# Patient Record
Sex: Female | Born: 1965 | Race: White | Hispanic: No | Marital: Single | Smoking: Never smoker
Health system: Southern US, Community
[De-identification: ages and names within clinical notes are randomized; demographics above are authoritative.]

## PROBLEM LIST (undated history)

## (undated) DIAGNOSIS — F419 Anxiety disorder, unspecified: Secondary | ICD-10-CM

## (undated) DIAGNOSIS — F329 Major depressive disorder, single episode, unspecified: Secondary | ICD-10-CM

## (undated) DIAGNOSIS — F909 Attention-deficit hyperactivity disorder, unspecified type: Secondary | ICD-10-CM

## (undated) DIAGNOSIS — F32A Depression, unspecified: Secondary | ICD-10-CM

## (undated) HISTORY — DX: Anxiety disorder, unspecified: F41.9

## (undated) HISTORY — DX: Attention-deficit hyperactivity disorder, unspecified type: F90.9

## (undated) HISTORY — DX: Major depressive disorder, single episode, unspecified: F32.9

## (undated) HISTORY — DX: Depression, unspecified: F32.A

---

## 1999-12-18 ENCOUNTER — Other Ambulatory Visit: Admission: RE | Admit: 1999-12-18 | Discharge: 1999-12-18 | Payer: Self-pay | Admitting: Obstetrics and Gynecology

## 2007-11-14 ENCOUNTER — Ambulatory Visit (HOSPITAL_COMMUNITY): Admission: RE | Admit: 2007-11-14 | Discharge: 2007-11-14 | Payer: Self-pay | Admitting: Obstetrics & Gynecology

## 2007-11-18 ENCOUNTER — Ambulatory Visit (HOSPITAL_COMMUNITY): Admission: RE | Admit: 2007-11-18 | Discharge: 2007-11-18 | Payer: Self-pay | Admitting: Family Medicine

## 2007-12-28 ENCOUNTER — Ambulatory Visit (HOSPITAL_COMMUNITY): Admission: RE | Admit: 2007-12-28 | Discharge: 2007-12-28 | Payer: Self-pay | Admitting: Obstetrics and Gynecology

## 2008-04-12 ENCOUNTER — Inpatient Hospital Stay (HOSPITAL_COMMUNITY): Admission: AD | Admit: 2008-04-12 | Discharge: 2008-04-12 | Payer: Self-pay | Admitting: Obstetrics and Gynecology

## 2008-04-12 ENCOUNTER — Emergency Department (HOSPITAL_COMMUNITY): Admission: EM | Admit: 2008-04-12 | Discharge: 2008-04-12 | Payer: Self-pay | Admitting: Emergency Medicine

## 2008-04-30 ENCOUNTER — Inpatient Hospital Stay (HOSPITAL_COMMUNITY): Admission: AD | Admit: 2008-04-30 | Discharge: 2008-05-01 | Payer: Self-pay | Admitting: Obstetrics and Gynecology
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
## 2011-02-14 ENCOUNTER — Emergency Department (HOSPITAL_COMMUNITY): Payer: BC Managed Care – PPO

## 2011-02-14 ENCOUNTER — Emergency Department (HOSPITAL_COMMUNITY)
Admission: EM | Admit: 2011-02-14 | Discharge: 2011-02-14 | Disposition: A | Discharge status: Home / Self Care | Payer: BC Managed Care – PPO | Attending: Emergency Medicine | Admitting: Emergency Medicine

## 2011-02-14 DIAGNOSIS — N949 Unspecified condition associated with female genital organs and menstrual cycle: Secondary | ICD-10-CM | POA: Insufficient documentation

## 2011-02-14 DIAGNOSIS — M545 Low back pain, unspecified: Secondary | ICD-10-CM | POA: Insufficient documentation

## 2011-02-14 DIAGNOSIS — X58XXXA Exposure to other specified factors, initial encounter: Secondary | ICD-10-CM | POA: Insufficient documentation

## 2011-02-14 DIAGNOSIS — F341 Dysthymic disorder: Secondary | ICD-10-CM | POA: Insufficient documentation

## 2011-02-14 DIAGNOSIS — IMO0002 Reserved for concepts with insufficient information to code with codable children: Secondary | ICD-10-CM | POA: Insufficient documentation

## 2011-02-14 LAB — TYPE AND SCREEN
ABO/RH(D): B POS
Antibody Screen: NEGATIVE

## 2011-02-14 LAB — DIFFERENTIAL
Basophils Absolute: 0.1 10*3/uL (ref 0.0–0.1)
Basophils Relative: 2 % — ABNORMAL HIGH (ref 0–1)
Neutro Abs: 3.4 10*3/uL (ref 1.7–7.7)
Neutrophils Relative %: 64 % (ref 43–77)

## 2011-02-14 LAB — URINALYSIS, ROUTINE W REFLEX MICROSCOPIC
Bilirubin Urine: NEGATIVE
Leukocytes, UA: NEGATIVE
Nitrite: NEGATIVE
Specific Gravity, Urine: 1.005 (ref 1.005–1.030)
pH: 7 (ref 5.0–8.0)

## 2011-02-14 LAB — PROTIME-INR
INR: 1.01 (ref 0.00–1.49)
Prothrombin Time: 13.5 seconds (ref 11.6–15.2)

## 2011-02-14 LAB — CBC
Hemoglobin: 12.7 g/dL (ref 12.0–15.0)
RBC: 4.67 MIL/uL (ref 3.87–5.11)

## 2011-02-14 LAB — PREGNANCY, URINE: Preg Test, Ur: POSITIVE

## 2011-04-20 LAB — CBC
HCT: 36.8
Hemoglobin: 11.7 — ABNORMAL LOW
MCHC: 33.9
MCV: 94.7
Platelets: 170
RDW: 13.1
RDW: 13.3
WBC: 7.2

## 2013-12-28 ENCOUNTER — Ambulatory Visit (INDEPENDENT_AMBULATORY_CARE_PROVIDER_SITE_OTHER): Payer: BC Managed Care – PPO | Admitting: Family Medicine

## 2013-12-28 ENCOUNTER — Telehealth: Payer: Self-pay

## 2013-12-28 VITALS — BP 104/66 | HR 81 | Temp 100.0°F | Resp 16 | Ht 65.5 in | Wt 116.4 lb

## 2013-12-28 DIAGNOSIS — H6690 Otitis media, unspecified, unspecified ear: Secondary | ICD-10-CM

## 2013-12-28 DIAGNOSIS — R509 Fever, unspecified: Secondary | ICD-10-CM

## 2013-12-28 DIAGNOSIS — H7290 Unspecified perforation of tympanic membrane, unspecified ear: Principal | ICD-10-CM

## 2013-12-28 DIAGNOSIS — H9209 Otalgia, unspecified ear: Secondary | ICD-10-CM

## 2013-12-28 LAB — POCT CBC
GRANULOCYTE PERCENT: 62.3 % (ref 37–80)
HCT, POC: 41.2 % (ref 37.7–47.9)
Hemoglobin: 12.8 g/dL (ref 12.2–16.2)
Lymph, poc: 1.8 (ref 0.6–3.4)
MCH: 27.5 pg (ref 27–31.2)
MCHC: 31.1 g/dL — AB (ref 31.8–35.4)
MCV: 88.5 fL (ref 80–97)
MID (CBC): 0.5 (ref 0–0.9)
MPV: 8.9 fL (ref 0–99.8)
PLATELET COUNT, POC: 303 10*3/uL (ref 142–424)
POC Granulocyte: 3.9 (ref 2–6.9)
POC LYMPH %: 29.8 % (ref 10–50)
POC MID %: 7.9 % (ref 0–12)
RBC: 4.65 M/uL (ref 4.04–5.48)
RDW, POC: 13.4 %
WBC: 6.2 10*3/uL (ref 4.6–10.2)

## 2013-12-28 MED ORDER — OFLOXACIN 0.3 % OT SOLN: 10.0000 [drp] | Freq: Every day | OTIC | Status: DC

## 2013-12-28 MED ORDER — AMOXICILLIN-POT CLAVULANATE 875-125 MG PO TABS: 1.0000 | ORAL_TABLET | Freq: Two times a day (BID) | ORAL | Status: DC

## 2013-12-28 MED ORDER — HYDROCODONE-ACETAMINOPHEN 5-325 MG PO TABS: 1.0000 | ORAL_TABLET | Freq: Four times a day (QID) | ORAL | Status: DC | PRN

## 2013-12-28 NOTE — Telephone Encounter (Signed)
Left message on machine to call back  

## 2013-12-28 NOTE — Telephone Encounter (Signed)
Patient has rupture of the ear drum. It is the only type of medication that is safe. Sorry.

## 2013-12-28 NOTE — Telephone Encounter (Signed)
[facility] at [facility])   Patient states ofloxacin (FLOXIN) 0.3 % otic solution Is to expensive and would like something else.  She has not met her deductible for the year.   [phone] (H)

## 2013-12-28 NOTE — Progress Notes (Signed)
Subjective:    Patient ID: Stephanie Fisher, female    DOB: [birth year], 48 y.o.   MRN: [id number]  HPI Stephanie Fisher is a 48 y.o. female  R ear pain past 2-3 days.   initially nasal congestion few days ago - proceeded to congestion/pain in R ear 2 days ago.  Fever - 99 at home. Difficulty hearing out of R ear, feels blocked.  Discharge noted yesterday - clear-yellow.  Did put Q tip in ear this morning for first time.   Tx: otc herbal ear drops.    There are no active problems to display for this patient.  Past Medical History  Diagnosis Date  . Anxiety   . Depression   . ADHD (attention deficit hyperactivity disorder)    History reviewed. No pertinent past surgical history. No Known Allergies Prior to Admission medications   Medication Sig Start Date End Date Taking? Authorizing Provider  atomoxetine (STRATTERA) 40 MG capsule Take 40 mg by mouth daily.   Yes Historical Provider, MD  FLUoxetine (PROZAC) 10 MG capsule Take 10 mg by mouth daily.   Yes Historical Provider, MD   History   Social History  . Marital Status: Single    Spouse Name: N/A    Number of Children: N/A  . Years of Education: N/A   Occupational History  . Not on file.   Social History Main Topics  . Smoking status: Never Smoker   . Smokeless tobacco: Not on file  . Alcohol Use: No  . Drug Use: No  . Sexual Activity: Yes   Other Topics Concern  . Not on file   Social History Narrative  . No narrative on file       Review of Systems  Constitutional: Positive for fever and chills.  HENT: Positive for ear discharge, ear pain and hearing loss. Negative for facial swelling.   Neurological: Positive for headaches.       Objective:   Physical Exam  Vitals reviewed. Constitutional: She is oriented to person, place, and time. She appears well-developed and well-nourished. No distress.  HENT:  Head: Normocephalic and atraumatic.  Right Ear: There is drainage (purulent d/c in canal, no apparent  canal edema/erythema. ). No mastoid tenderness (not mastoid ttp, but tender post auricular lymph node. ). Tympanic membrane is injected, perforated (suspected anterior inferior rupture, with purulent discharge in canal, erythematous tm.  ) and erythematous. A middle ear effusion is present. No hemotympanum. Decreased hearing is noted.  Left Ear: Hearing, tympanic membrane, external ear and ear canal normal.  Nose: Nose normal.  Mouth/Throat: Oropharynx is clear and moist. No oropharyngeal exudate.  Eyes: Conjunctivae and EOM are normal. Pupils are equal, round, and reactive to light.  Neck: Neck supple.  Cardiovascular: Normal rate, regular rhythm, normal heart sounds and intact distal pulses.   No murmur heard. Pulmonary/Chest: Effort normal and breath sounds normal. No respiratory distress. She has no wheezes. She has no rhonchi.  Lymphadenopathy:    She has no cervical adenopathy.  Neurological: She is alert and oriented to person, place, and time.  Skin: Skin is warm and dry. No rash noted.  Psychiatric: She has a normal mood and affect. Her behavior is normal.   Filed Vitals:   12/28/13 1043  BP: 104/66  Pulse: 81  Temp: 100 F (37.8 C)  TempSrc: Oral  Resp: 16  Height: 5' 5.5" (1.664 m)  Weight: 116 lb 6.4 oz (52.799 kg)  SpO2: 100%   Results for orders  placed in visit on 12/28/13  POCT CBC      Result Value Ref Range   WBC 6.2  4.6 - 10.2 K/uL   Lymph, poc 1.8  0.6 - 3.4   POC LYMPH PERCENT 29.8  10 - 50 %L   MID (cbc) 0.5  0 - 0.9   POC MID % 7.9  0 - 12 %M   POC Granulocyte 3.9  2 - 6.9   Granulocyte percent 62.3  37 - 80 %G   RBC 4.65  4.04 - 5.48 M/uL   Hemoglobin 12.8  12.2 - 16.2 g/dL   HCT, POC 16.141.2  09.637.7 - 47.9 %   MCV 88.5  80 - 97 fL   MCH, POC 27.5  27 - 31.2 pg   MCHC 31.1 (*) 31.8 - 35.4 g/dL   RDW, POC 04.513.4     Platelet Count, POC 303  142 - 424 K/uL   MPV 8.9  0 - 99.8 fL       Assessment & Plan:   Stephanie JarvisLee A Plain is a 48 y.o. female Otitis media  with spontaneous rupture of eardrum - Plan: ofloxacin (FLOXIN) 0.3 % otic solution, amoxicillin-clavulanate (AUGMENTIN) 875-125 MG per tablet  Pain, ear  Fever - Plan: POCT CBC  Start Augmentin, floxin otic for R AOM with rupture. advil if needed, and lortab for increased pain.   Mastoid nontender today. If any worsening of sx's into tomorrow - recheck with Eula Listenyan Dunn. PA-C.  rtc precautions.   Meds ordered this encounter  Medications  . atomoxetine (STRATTERA) 40 MG capsule    Sig: Take 40 mg by mouth daily.  Marland Kitchen. FLUoxetine (PROZAC) 10 MG capsule    Sig: Take 10 mg by mouth daily.  Marland Kitchen. ofloxacin (FLOXIN) 0.3 % otic solution    Sig: Place 10 drops into the right ear daily.    Dispense:  10 mL    Refill:  0  . amoxicillin-clavulanate (AUGMENTIN) 875-125 MG per tablet    Sig: Take 1 tablet by mouth 2 (two) times daily.    Dispense:  20 tablet    Refill:  0   Patient Instructions  Start both antibiotic drops to ear and oral antibiotic. Advil ok for pain or fever. If needed - can take other prescription pain medicine.

## 2013-12-28 NOTE — Telephone Encounter (Signed)
Please advise on more cost effective alternative to the ofloxacin drops

## 2013-12-28 NOTE — Patient Instructions (Addendum)
Start antibiotic drops to ear and oral antibiotic. Advil ok for pain or fever. If needed - can take other prescription pain medicine.  If any worsening into tomorrow- return for evaluation with Eula Listen. PA-C.  Return to the clinic or go to the nearest emergency room if any of your symptoms worsen or new symptoms occur. Otitis Media, Adult Otitis media is redness, soreness, and swelling (inflammation) of the middle ear. Otitis media may be caused by allergies or, most commonly, by infection. Often it occurs as a complication of the common cold. SIGNS AND SYMPTOMS Symptoms of otitis media may include:  Earache.  Fever.  Ringing in your ear.  Headache.  Leakage of fluid from the ear. DIAGNOSIS To diagnose otitis media, your health care provider will examine your ear with an otoscope. This is an instrument that allows your health care provider to see into your ear in order to examine your eardrum. Your health care provider also will ask you questions about your symptoms. TREATMENT  Typically, otitis media resolves on its own within 3 5 days. Your health care provider may prescribe medicine to ease your symptoms of pain. If otitis media does not resolve within 5 days or is recurrent, your health care provider may prescribe antibiotic medicines if he or she suspects that a bacterial infection is the cause. HOME CARE INSTRUCTIONS   Take your medicine as directed until it is gone, even if you feel better after the first few days.  Only take over-the-counter or prescription medicines for pain, discomfort, or fever as directed by your health care provider.  Follow up with your health care provider as directed. SEEK MEDICAL CARE IF:  You have otitis media only in one ear or bleeding from your nose or both.  You notice a lump on your neck.  You are not getting better in 3 5 days.  You feel worse instead of better. SEEK IMMEDIATE MEDICAL CARE IF:   You have pain that is not controlled with  medicine.  You have swelling, redness, or pain around your ear or stiffness in your neck.  You notice that part of your face is paralyzed.  You notice that the bone behind your ear (mastoid) is tender when you touch it. MAKE SURE YOU:   Understand these instructions.  Will watch your condition.  Will get help right away if you are not doing well or get worse. Document Released: 04/10/2004 Document Revised: 04/26/2013 Document Reviewed: 01/31/2013 [facility] Patient Information 2014 [place], [place].

## 2013-12-29 NOTE — Telephone Encounter (Signed)
Pt.notified

## 2014-01-03 ENCOUNTER — Ambulatory Visit (INDEPENDENT_AMBULATORY_CARE_PROVIDER_SITE_OTHER): Payer: BC Managed Care – PPO | Admitting: Family Medicine

## 2014-01-03 VITALS — BP 102/66 | HR 76 | Temp 98.0°F | Resp 16 | Ht 65.5 in | Wt 118.0 lb

## 2014-01-03 DIAGNOSIS — H7290 Unspecified perforation of tympanic membrane, unspecified ear: Principal | ICD-10-CM

## 2014-01-03 DIAGNOSIS — H6690 Otitis media, unspecified, unspecified ear: Secondary | ICD-10-CM

## 2014-01-03 MED ORDER — AMOXICILLIN-POT CLAVULANATE 875-125 MG PO TABS: 1.0000 | ORAL_TABLET | Freq: Two times a day (BID) | ORAL | Status: DC

## 2014-01-03 NOTE — Progress Notes (Signed)
Subjective:    Patient ID: Stephanie JarvisLee A Ton, female    DOB: 01/25/1966, 48 y.o.   MRN: [id number]  HPI Stephanie Fisher is a 48 y.o. female  Seen 6 days ago with 2-3 day hx R ear pain.  Diagnosed with R AOM with rupture. Started on Augmentin and floxin otic. Had low grade fever, CBC ok in office. D/t rupture - floxin otic rx - noted telephone messages re: cost.   Here for follow up.   Was not able to afford drops, so did not start the floxin otic drops.  Has been taking the other antibiotic (augmentin - taking twice per day, no missed doses).  No fever. Less sore - pain has almost completely subsided - only occasional ache/twinge.  Still unable to hear much out of the R ear.  No recent discharge.  Last noted d/c few days ago.  Did use some of mother's antibiotic drops - for ear infection, but unknown name.  Used those drops twice only. Not using those now. Took ibuprofen, did not need to fill hydrocodone.   Min runny nose. Feeling well otherwise.   There are no active problems to display for this patient.  Past Medical History  Diagnosis Date  . Anxiety   . Depression   . ADHD (attention deficit hyperactivity disorder)    No past surgical history on file. No Known Allergies Prior to Admission medications   Medication Sig Start Date End Date Taking? Authorizing Aul Mangieri  amoxicillin-clavulanate (AUGMENTIN) 875-125 MG per tablet Take 1 tablet by mouth 2 (two) times daily. 12/28/13  Yes Shade FloodJeffrey R Greene, MD  atomoxetine (STRATTERA) 40 MG capsule Take 40 mg by mouth daily.   Yes Historical Giannina Bartolome, MD  FLUoxetine (PROZAC) 10 MG capsule Take 10 mg by mouth daily.   Yes Historical Kendria Halberg, MD  HYDROcodone-acetaminophen (NORCO/VICODIN) 5-325 MG per tablet Take 1 tablet by mouth every 6 (six) hours as needed for moderate pain. 12/28/13  Yes Shade FloodJeffrey R Greene, MD   History   Social History  . Marital Status: Single    Spouse Name: N/A    Number of Children: N/A  . Years of Education: N/A    Occupational History  . Not on file.   Social History Main Topics  . Smoking status: Never Smoker   . Smokeless tobacco: Not on file  . Alcohol Use: No  . Drug Use: No  . Sexual Activity: Yes   Other Topics Concern  . Not on file   Social History Narrative  . No narrative on file       Review of Systems  Constitutional: Negative for fever and chills.  HENT: Positive for ear pain and hearing loss. Negative for ear discharge and facial swelling.   Skin: Positive for rash.       Objective:   Physical Exam  Vitals reviewed. Constitutional: She is oriented to person, place, and time. She appears well-developed and well-nourished. No distress.  HENT:  Head: Normocephalic and atraumatic.  Right Ear: External ear and ear canal normal. No drainage, swelling or tenderness (mastoid nt. ). Tympanic membrane is injected, erythematous and bulging. Tympanic membrane is not perforated. A middle ear effusion is present. Decreased hearing is noted.  Left Ear: Hearing, tympanic membrane, external ear and ear canal normal.  Nose: Nose normal.  Mouth/Throat: Oropharynx is clear and moist. No oropharyngeal exudate.  Eyes: Conjunctivae and EOM are normal. Pupils are equal, round, and reactive to light.  Cardiovascular: Normal rate, regular rhythm, normal  heart sounds and intact distal pulses.   No murmur heard. Pulmonary/Chest: Effort normal and breath sounds normal. No respiratory distress. She has no wheezes. She has no rhonchi.  Neurological: She is alert and oriented to person, place, and time.  Skin: Skin is warm and dry. No rash noted.  Psychiatric: She has a normal mood and affect. Her behavior is normal.  mastoid nontender.    Filed Vitals:   01/03/14 1129  BP: 102/66  Pulse: 76  Temp: 98 F (36.7 C)  TempSrc: Oral  Resp: 16  Height: 5' 5.5" (1.664 m)  Weight: 118 lb (53.524 kg)  SpO2: 100%       Assessment & Plan:   Stephanie Fisher is a 48 y.o. female Otitis media  with spontaneous rupture of eardrum - Plan: amoxicillin-clavulanate (AUGMENTIN) 875-125 MG per tablet Now without visible perforation, but persistent effusion, injection of TM and bulging TM.  Will continue augmentin for full 2 week course, then recheck. Advised against water in ear, and discussed decreased hearing may persist until fluid resolves, and possibility of not regaining complete hearing after this infection. rtc precautions.  recheck in 8 days.   Meds ordered this encounter  Medications  . amoxicillin-clavulanate (AUGMENTIN) 875-125 MG per tablet    Sig: Take 1 tablet by mouth 2 (two) times daily.    Dispense:  8 tablet    Refill:  0   Patient Instructions  Continue antibiotic for 2 full weeks.  Recheck at end of antibiotic (June 24th after 4pm, or June 25th 8-2). Try to keep water out of affected ear - especially if starts draining again.  Return to the clinic or go to the nearest emergency room if any of your symptoms worsen or new symptoms occur.  Otitis Media Otitis media is redness, soreness, and puffiness (swelling) in the space just behind your eardrum (middle ear). It may be caused by allergies or infection. It often happens along with a cold. HOME CARE  Take your medicine as told. Finish it even if you start to feel better.  Only take over-the-counter or prescription medicines for pain, discomfort, or fever as told by your doctor.  Follow up with your doctor as told. GET HELP IF:  You have otitis media only in one ear, or bleeding from your nose, or both.  You notice a lump on your neck.  You are not getting better in 3-5 days.  You feel worse instead of better. GET HELP RIGHT AWAY IF:   You have pain that is not helped with medicine.  You have puffiness, redness, or pain around your ear.  You get a stiff neck.  You cannot move part of your face (paralysis).  You notice that the bone behind your ear hurts when you touch it. MAKE SURE YOU:   Understand  these instructions.  Will watch your condition.  Will get help right away if you are not doing well or get worse. Document Released: 12/23/2007 Document Revised: 07/11/2013 Document Reviewed: 01/31/2013 [facility] Patient Information 2015 [place], [place]. This information is not intended to replace advice given to you by your health care Sianni Cloninger. Make sure you discuss any questions you have with your health care Izyk Marty.

## 2014-01-03 NOTE — Patient Instructions (Signed)
Continue antibiotic for 2 full weeks.  Recheck at end of antibiotic (June 24th after 4pm, or June 25th 8-2). Try to keep water out of affected ear - especially if starts draining again.  Return to the clinic or go to the nearest emergency room if any of your symptoms worsen or new symptoms occur.  Otitis Media Otitis media is redness, soreness, and puffiness (swelling) in the space just behind your eardrum (middle ear). It may be caused by allergies or infection. It often happens along with a cold. HOME CARE  Take your medicine as told. Finish it even if you start to feel better.  Only take over-the-counter or prescription medicines for pain, discomfort, or fever as told by your doctor.  Follow up with your doctor as told. GET HELP IF:  You have otitis media only in one ear, or bleeding from your nose, or both.  You notice a lump on your neck.  You are not getting better in 3-5 days.  You feel worse instead of better. GET HELP RIGHT AWAY IF:   You have pain that is not helped with medicine.  You have puffiness, redness, or pain around your ear.  You get a stiff neck.  You cannot move part of your face (paralysis).  You notice that the bone behind your ear hurts when you touch it. MAKE SURE YOU:   Understand these instructions.  Will watch your condition.  Will get help right away if you are not doing well or get worse. Document Released: 12/23/2007 Document Revised: 07/11/2013 Document Reviewed: 01/31/2013 [facility] Patient Information 2015 [place], [place]. This information is not intended to replace advice given to you by your health care provider. Make sure you discuss any questions you have with your health care provider.

## 2016-08-22 ENCOUNTER — Ambulatory Visit (INDEPENDENT_AMBULATORY_CARE_PROVIDER_SITE_OTHER): Payer: BLUE CROSS/BLUE SHIELD | Admitting: Urgent Care

## 2016-08-22 VITALS — BP 98/58 | HR 71 | Temp 98.8°F | Resp 16 | Ht 65.5 in | Wt 124.2 lb

## 2016-08-22 DIAGNOSIS — R109 Unspecified abdominal pain: Secondary | ICD-10-CM | POA: Diagnosis not present

## 2016-08-22 DIAGNOSIS — K5900 Constipation, unspecified: Secondary | ICD-10-CM

## 2016-08-22 DIAGNOSIS — R195 Other fecal abnormalities: Secondary | ICD-10-CM | POA: Diagnosis not present

## 2016-08-22 DIAGNOSIS — K319 Disease of stomach and duodenum, unspecified: Secondary | ICD-10-CM | POA: Diagnosis not present

## 2016-08-22 DIAGNOSIS — R1013 Epigastric pain: Secondary | ICD-10-CM

## 2016-08-22 DIAGNOSIS — K30 Functional dyspepsia: Secondary | ICD-10-CM

## 2016-08-22 DIAGNOSIS — R2232 Localized swelling, mass and lump, left upper limb: Secondary | ICD-10-CM | POA: Diagnosis not present

## 2016-08-22 NOTE — Progress Notes (Signed)
    MRN: [id number] DOB: 12/20/1965  Subjective:   Markus JarvisLee A Wojdyla is a 51 y.o. female presenting for chief complaint of Mass (Under Left Arm Pit)  Mass - Reports a 3 month history of mass over her left arm pit. Patient wants to make sure she is okay. Admits that she had a screening mammogram ~6 months ago and was commpletly normal. Denies fever, redness, pain, worsening swelling, discharge, bleeding, nipple inversion, nipple discharge, skin changes of the breast.  GI - Reports longstanding history of intermittent constipation, loose stools, belly cramping and upset stomach. Patient has worked with a GI doctor before but did not finish work up. She plans on setting up another appointment for follow up. Patient is adopted so she does not know her family history. Denies ROS as above, bloody stools, vomiting, weight loss. Has a history of an eating disorder.  Nedra HaiLee currently has no medications in their medication list. Also has No Known Allergies.  Nedra HaiLee  has a past medical history of ADHD (attention deficit hyperactivity disorder); Anxiety; and Depression. Also  has no past surgical history on file.  Objective:   Vitals: BP (!) 98/58   Pulse 71   Temp 98.8 F (37.1 C) (Oral)   Resp 16   Ht 5' 5.5" (1.664 m)   Wt 124 lb 4 oz (56.4 kg)   LMP 08/01/2016 (Approximate)   SpO2 98%   BMI 20.36 kg/m   Physical Exam  Constitutional: She is oriented to person, place, and time. She appears well-developed and well-nourished.  HENT:  Mouth/Throat: Oropharynx is clear and moist.  Eyes: No scleral icterus.  Neck: Normal range of motion. Neck supple.  Cardiovascular: Normal rate, regular rhythm and intact distal pulses.  Exam reveals no gallop and no friction rub.   No murmur heard. Pulmonary/Chest: No respiratory distress. She has no wheezes. She has no rales.  Abdominal: Soft. Bowel sounds are normal. She exhibits no distension and no mass. There is tenderness (mild, lower abdomen). There is no  guarding.  Lymphadenopathy:    She has no cervical adenopathy.  Neurological: She is alert and oriented to person, place, and time.  Skin: Skin is warm and dry. Rash (patches of macular rash over axilla bilaterally) noted.      Assessment and Plan :   1. Mass of left axilla - I suspect a lipoma. Discussed differential with patient. She will think about monitoring versus removal. Recheck in 4 weeks or sooner if worsening.  2. Constipation, unspecified constipation type 3. Loose stools 4. Belly pain 5. Chronic upset stomach - Patient will try to go to GI doctor again. She will let me know if she would like to pursue blood work with me.   Wallis BambergMario Mercedes Fort, PA-C [facility] [phone] 08/22/2016  2:15 PM

## 2016-08-22 NOTE — Patient Instructions (Addendum)
Lipoma Introduction A lipoma is a noncancerous (benign) tumor that is made up of fat cells. This is a very common type of soft-tissue growth. Lipomas are usually found under the skin (subcutaneous). They may occur in any tissue of the body that contains fat. Common areas for lipomas to appear include the back, shoulders, buttocks, and thighs. Lipomas grow slowly, and they are usually painless. Most lipomas do not cause problems and do not require treatment. What are the causes? The cause of this condition is not known. What increases the risk? This condition is more likely to develop in:  People who are 53-57 years old.  People who have a family history of lipomas. What are the signs or symptoms? A lipoma usually appears as a small, round bump under the skin. It may feel soft or rubbery, but the firmness can vary. Most lipomas are not painful. However, a lipoma may become painful if it is located in an area where it pushes on nerves. How is this diagnosed? A lipoma can usually be diagnosed with a physical exam. You may also have tests to confirm the diagnosis and to rule out other conditions. Tests may include:  Imaging tests, such as a CT scan or MRI.  Removal of a tissue sample to be looked at under a microscope (biopsy). How is this treated? Treatment is not needed for small lipomas that are not causing problems. If a lipoma continues to get bigger or it causes problems, removal is often the best option. Lipomas can also be removed to improve appearance. Removal of a lipoma is usually done with a surgery in which the fatty cells and the surrounding capsule are removed. Most often, a medicine that numbs the area (local anesthetic) is used for this procedure. Follow these instructions at home:  Keep all follow-up visits as directed by your health care provider. This is important. Contact a health care provider if:  Your lipoma becomes larger or hard.  Your lipoma becomes painful, red, or  increasingly swollen. These could be signs of infection or a more serious condition. This information is not intended to replace advice given to you by your health care provider. Make sure you discuss any questions you have with your health care provider. Document Released: 06/26/2002 Document Revised: 12/12/2015 Document Reviewed: 07/02/2014  2017 Elsevier    Lipoma Removal Lipoma removal is a surgical procedure to remove a noncancerous (benign) tumor that is made up of fat cells (lipoma). Most lipomas are small and painless and do not require treatment. They can form in many areas of the body but are most common under the skin of the back, shoulders, arms, and thighs. You may need lipoma removal if you have a lipoma that is large, growing, or causing discomfort. Lipoma removal may also be done for cosmetic reasons. Tell a health care provider about:  Any allergies you have.  All medicines you are taking, including vitamins, herbs, eye drops, creams, and over-the-counter medicines.  Any problems you or family members have had with anesthetic medicines.  Any blood disorders you have.  Any surgeries you have had.  Any medical conditions you have.  Whether you are pregnant or may be pregnant. What are the risks? Generally, this is a safe procedure. However, problems may occur, including:  Infection.  Bleeding.  Allergic reactions to medicines.  Damage to nerves or blood vessels near the lipoma.  Scarring. What happens before the procedure? Staying hydrated  Follow instructions from your health care provider about hydration,  which may include:  Up to 2 hours before the procedure - you may continue to drink clear liquids, such as water, clear fruit juice, black coffee, and plain tea. Eating and drinking restrictions  Follow instructions from your health care provider about eating and drinking, which may include:  8 hours before the procedure - stop eating heavy meals or  foods such as meat, fried foods, or fatty foods.  6 hours before the procedure - stop eating light meals or foods, such as toast or cereal.  6 hours before the procedure - stop drinking milk or drinks that contain milk.  2 hours before the procedure - stop drinking clear liquids. Medicines  Ask your health care provider about:  Changing or stopping your regular medicines. This is especially important if you are taking diabetes medicines or blood thinners.  Taking medicines such as aspirin and ibuprofen. These medicines can thin your blood. Do not take these medicines before your procedure if your health care provider instructs you not to.  You may be given antibiotic medicine to help prevent infection. General instructions  Ask your health care provider how your surgical site will be marked or identified.  You will have a physical exam. Your health care provider will check the size of the lipoma and whether it can be moved easily.  You may have imaging tests, such as:  X-rays.  CT scan.  MRI.  Plan to have someone take you home from the hospital or clinic. What happens during the procedure?  To reduce your risk of infection:  Your health care team will wash or sanitize their hands.  Your skin will be washed with soap.  You will be given one or more of the following:  A medicine to help you relax (sedative).  A medicine to numb the area (local anesthetic).  A medicine to make you fall asleep (general anesthetic).  A medicine that is injected into an area of your body to numb everything below the injection site (regional anesthetic).  An incision will be made over the lipoma or very near the lipoma. The incision may be made in a natural skin line or crease.  Tissues, nerves, and blood vessels near the lipoma will be moved out of the way.  The lipoma and the capsule that surrounds it will be separated from the surrounding tissues.  The lipoma will be  removed.  The incision may be closed with stitches (sutures).  A bandage (dressing) will be placed over the incision. What happens after the procedure?  Do not drive for 24 hours if you received a sedative.  Your blood pressure, heart rate, breathing rate, and blood oxygen level will be monitored until the medicines you were given have worn off. This information is not intended to replace advice given to you by your health care provider. Make sure you discuss any questions you have with your health care provider. Document Released: 09/19/2015 Document Revised: 12/12/2015 Document Reviewed: 09/19/2015 Elsevier Interactive Patient Education  2017 [employer]     IF you received an x-ray today, you will receive an invoice from [facility]. Please contact [facility] at [phone] with questions or concerns regarding your invoice.   IF you received labwork today, you will receive an invoice from [facility]. Please contact [facility] at [phone] with questions or concerns regarding your invoice.   Our billing staff will not be able to assist you with questions regarding bills from these companies.  You will be contacted with the lab  results as soon as they are available. The fastest way to get your results is to activate your My Chart account. Instructions are located on the last page of this paperwork. If you have not heard from [place] regarding the results in 2 weeks, please contact this office.

## 2019-12-08 ENCOUNTER — Other Ambulatory Visit: Payer: Self-pay | Admitting: Family Medicine

## 2019-12-08 DIAGNOSIS — Z8659 Personal history of other mental and behavioral disorders: Secondary | ICD-10-CM

## 2019-12-12 ENCOUNTER — Encounter: Payer: Self-pay | Admitting: Neurology

## 2020-01-01 ENCOUNTER — Ambulatory Visit
Admission: RE | Admit: 2020-01-01 | Discharge: 2020-01-01 | Disposition: A | Discharge status: Home / Self Care | Payer: BC Managed Care – PPO | Source: Ambulatory Visit | Attending: Family Medicine | Admitting: Family Medicine

## 2020-01-01 DIAGNOSIS — Z8659 Personal history of other mental and behavioral disorders: Secondary | ICD-10-CM

## 2020-01-08 ENCOUNTER — Other Ambulatory Visit: Payer: Self-pay

## 2020-01-08 ENCOUNTER — Encounter: Payer: Self-pay | Admitting: Counselor

## 2020-01-08 ENCOUNTER — Ambulatory Visit (INDEPENDENT_AMBULATORY_CARE_PROVIDER_SITE_OTHER): Payer: BC Managed Care – PPO | Admitting: Counselor

## 2020-01-08 DIAGNOSIS — F4321 Adjustment disorder with depressed mood: Secondary | ICD-10-CM

## 2020-01-08 DIAGNOSIS — F4323 Adjustment disorder with mixed anxiety and depressed mood: Secondary | ICD-10-CM

## 2020-01-08 DIAGNOSIS — Z634 Disappearance and death of family member: Secondary | ICD-10-CM

## 2020-01-08 DIAGNOSIS — F909 Attention-deficit hyperactivity disorder, unspecified type: Secondary | ICD-10-CM

## 2020-01-08 NOTE — Progress Notes (Signed)
[facility]  Patient Name: Stephanie Fisher MRN: [id number] Date of Birth: 14-Jul-1966 Age: 54 y.o. Education: 14 years  Referral Circumstances and Background Information  Stephanie Fisher is a 54 y.o., right-hand dominant, single with a history of psychiatric difficulties (referral mentions diagnoses of GAD, panic disorder, unspecified mood disorder), polysubstance dependence (in remission), and concerns about cognitive functions. She was referred by Dr. Darron Doom with [facility] for neurocognitive evaluation.    On interview, the patient stated that had an episode of depersonalization/dissociation after waking up last summer. Shortly after that, her son passed. She feels like since that time, she has been experiencing significant difficulties with organization, planning, remembering things, and feeling scattered. She admits she has always had some degree of difficulty with the same and has questioned if she may not have ADHD. After the episode, she felt like she was waking up every morning feeling "terrified," and generally was under a lot of stress. 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 She felt like she wasn't performing well and had a panic attack as a result. 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 She has been not back to work since. She thinks her issues are marginally better since then, she is no longer "falling apart" when trying to cook a meal, but she still is having a hard time. She specifically endorsed  difficulties with planning, sequencing behaviors, organizing things, with memory, distraction, and in general feels scattered.  With respect to affective functioning the patient feels like she is under a great deal of stress trying to manage her life, her childrens' lives (she has two children at home), and she is also taking care of her mother.She feels anxious, and it sounds like she feels somewhat overwhelmed and has for a long time. She acknowledged feeling sad and became tearful when discussing it. Nevertheless, she reported that she is able to sleep adequately, usually 7-8 hours. Her energy is adequate but her motivation is low and it is difficult for her to "get up and function" in the morning. It sounds like in general, she feels adrift and wonders "whose life" she is living. She feels like she is reacting to things rather than being proactive. Her appetite is adequate although she has been losing weight (less than 20lbs). She feels lacking in motivation and like she is just doing "what is in front of her." With respect to functioning, she is fully independent, with the exception of not working. She has only worked sporadically throughout her adult life so that may not be a very significant change. It sounds like she is not as efficient as she was in the past, she can get side tracked easily, and she has a harder time but she is still managing all her own medications, she is driving and isn't getting lost, she is managing her own finances, cooking and doing things around the house.   Past Medical History and Review of Relevant Studies  There are no problems to display for this patient.  Review of Neuroimaging and Relevant Medical History: :  The patient has a CT of the head from 01/01/2020 that appears normal, with good brain volume and normal morphology for age and no concerning areas of leukoaraiosis.   Patient has no previous mental status testing.   The patient is recently postmenopausal  and reported that she stopped having her cycle several months ago.   No current outpatient medications on file.   No current facility-administered medications for this visit.  The patient is currently taking Lamictal, several days ago. She was prescribed the medication some time ago but reported that she didn't take it. She was also prescribed Seroquel but felt like it had intolerable side effects.   Family History  Adopted: Yes   Any family history of dementia is unknown, the patient was adopted. Likewise, she is unsure of any family history of psychiatric illness.  Psychosocial History  Developmental, Educational and Employment History: The patient described herself as a poor student who never did well in school. She stated that she had a hard time figuring out how to do school and focus on things. She always felt like "everything was so much harder for me" as compared to her family members. Eventually, she stopped trying. She prioritized social life above other things. She was never held back but she said she earned mainly C's and D's. Nevertheless, she did graduate. She went to [employer] and studied liberal arts for two years but then stopped because she didn't have a sense of direction and was partying too much. She has a spotty employment history and reported that she has worked "off and on" for most of her adult life, she has supported herself with a trust and feels enabled by her parents. 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 She would like to go back to work eventually.  Psychiatric History: The patient reported that she was previously diagnosed with ADHD, several years ago. She wasn't clear regarding the circumstances that lead to her decision to seek an evaluation, although it sounds like as she reflected on things in  her life, she started to question if she had ADHD. She wondered why she made the choices she made, why she wasn't successful, and she went to [facility] who thought that she did have the condition. She never followed up on treatment because she "couldn't stand the medications." She was on vyvanse. It sounds like she does have a history of inattention when she was younger and her parents took her to [facility] for some sort of testing related to learning problems. She perceived herself as inattentive as a child. She was quite rebellious and would sneak out of her house at night and take her parents car. She reported that she has had a very hard time identifying a direction for herself and figuring out what she wants to do in life. She is currently in psychiatric care, she is taking Abilify but isn't sure exactly why it is prescribed and she stopped taking it (she sees K[facility] at the [facility]). She is not in counseling. The patient also has a history of eating disorder (bulimia) from 20 until her early 45s. She got some treatment for that. She stated that she has a history of trauma that she didn't wish to discuss in detail, mostly in  the context of her substance use issues.   Substance Use History: The patient has a history of substance use problems. She started using in her mid 69s and continued to do so actively until around 41. She was using cocaine on an almost daily basis periodically throughout that period of time. She reported that she was using crack cocaine and IV cocaine in addition to powdered cocaine. She tried to get sober on several occasions and did multiple 28 day programs, was involved in [place], and the like. Eventually, she moved back to [place] and achieved sobriety via AA, which she is actively involved in, and she has been sober since then.    Relationship History and Living Cimcumstances: The patient was vague about whether she has been married, it sounds  like she got married twice but it was half hearted. She has had long term relationships and stated that they tend to be tumultuous. She is currently living with her mother and her two children.   Mental Status and Behavioral Observations  Sensorium/Arousal: The patient's level of arousal was awake and alert. Hearing and vision were adequate for testing purposes. Orientation: The patient was oriented to person, place, time, and situation.  Appearance: Slender woman who appeared her stated age dressed in appropriate, casual clothing with adequate grooming and hygiene.  Behavior: The patient presented as somewhat overwhelmed and scattered in her history but nevertheless was able to provide a decent personal timeline with redirection.  Speech/language: Speech was fast in rate, normal in rhythm, volume, and prosody.  Gait/Posture: Appeared normal with narrow base, normal stride length, and adequate turns.  Movement: No overt signs/symptoms of movement disorder.  Social Comportment: The patient was pleasant, appropriate, and well-engaged throughout the interview Mood: "Overwhelmed."  Affect: Anxious Thought process/content: The patient presented as somewhat scattered but her thought process was not frankly disorganized. She was able to provide a reasonably detailed personal history and timeline. Thought content was appropriate to the topics discussed.  Safety: No thoughts of harming self or others.  Insight: Fair  Plan  Stephanie Fisher was seen for a psychiatric diagnostic evaluation. She is a 54 year old, right-hand dominant woman with a history of ADHD, ill defined psychiatric issues mainly manifesting with feeling overwhelmed at the present time, bulimia, and substance use issues (sober for 12 years now). She has a history of inattentiveness and symptoms suggestive of ADHD but more recently, has had increasing difficulties with organization, feeling scattered, and the like. She has been under a great  deal of stress and lost her son to a drug overdose just over a year ago. Due to scheduling issues she was not able to stay for testing today, she will return to clinic to complete testing. Full and complete note with impressions, recommendations, and interpretation of test data to follow.   Stephanie Boeck Roseanne Reno PsyD, ABN Clinical Neuropsychologist  Services associated with this encounter: Clinical Interview [phone])

## 2020-01-12 ENCOUNTER — Other Ambulatory Visit: Payer: Self-pay

## 2020-01-12 ENCOUNTER — Ambulatory Visit: Payer: BC Managed Care – PPO | Admitting: Psychology

## 2020-01-12 DIAGNOSIS — F09 Unspecified mental disorder due to known physiological condition: Secondary | ICD-10-CM

## 2020-01-12 NOTE — Progress Notes (Signed)
   Psychometrist Note   Cognitive testing was administered to Stephanie Fisher by Milana Kidney, B.S. (Technician) under the supervision of Alphonzo Severance, Psy.D., ABN. [name] was able to tolerate all test procedures. Dr. Nicole Kindred met with the patient as needed to manage any emotional reactions to the testing procedures. Rest breaks were offered.    The battery of tests administered was selected by Dr. Nicole Kindred with consideration to the patient's current level of functioning, the nature of her symptoms, emotional and behavioral responses during the interview, level of literacy, observed level of motivation/effort, and the nature of the referral question. This battery was communicated to the psychometrist. Communication between Dr. Nicole Kindred and the psychometrist was ongoing throughout the evaluation and Dr. Nicole Kindred was immediately accessible at all times. Dr. Nicole Kindred provided supervision to the technician on the date of this service, to the extent necessary to assure the quality of all services provided.    Stephanie Fisher will return in approximately one week for an interactive feedback session with Dr. Nicole Kindred, at which time female test performance, clinical impressions, and treatment recommendations will be reviewed in detail. The patient understands she can contact our office should she require our assistance before this time.   A total of 115 minutes of billable time were spent with Stephanie Fisher by the technician, including test administration and scoring time. Billing for these services is reflected in Dr. Les Pou note.   This note reflects time spent with the psychometrician and does not include test scores, clinical history, or any interpretations made by Dr. Nicole Kindred. The full report will follow in a separate note.

## 2020-01-15 ENCOUNTER — Encounter: Payer: BC Managed Care – PPO | Admitting: Counselor

## 2020-01-15 NOTE — Progress Notes (Signed)
[facility]  Patient Name: Stephanie Fisher MRN: [id number] Date of Birth: 06/22/1966 Age: 54 y.o. Education: 14 years  Measurement properties of test scores: IQ, Index, and Standard Scores (SS): Mean = 100; Standard Deviation = 15 Scaled Scores (Ss): Mean = 10; Standard Deviation = 3 Z scores (Z): Mean = 0; Standard Deviation = 1 T scores (T); Mean = 50; Standard Deviation = 10  TEST SCORES:    Note: This summary of test scores accompanies the interpretive report and should not be interpreted by unqualified individuals or in isolation without reference to the report. Test scores are relative to age, gender, and educational history as available and appropriate.   Performance Validity        ACS: Raw Descriptor      Word Choice: 50 Within Expectation      MSVT: Raw Descriptor      IR 100 Within Expectation      DR 100 Within Expectation      CNS 100 Within Expectation      PA 90 ---      FR 90 ---      The Dot Counting Test: Raw Descriptor      E-Score 7 Within Expectation      Embedded Measures: Raw Descriptor      RBANS Effort Index: 2 Within Expectation      WAIS-IV Reliable Digit Span: 10 Within Expectation      WAIS-IV Reliable Digit Span Revised 14 Within Expectation      Expected Functioning        Wide Range Achievement Test (Word Reading): Standard/Scaled Score Percentile       Word Reading 110 75      Cognitive Testing        RBANS, Form : Standard/Scaled Score Percentile  Total Score 88 21  Immediate Memory 83 13      List Learning 3 1      Story Memory 11 63  Visuospatial/Constructional 112 79      Figure Copy   (19) 11 63      Line Orientation --- >75  Language 94 34      Picture Naming --- 51-75      Semantic Fluency 8 25  Attention 100 50      Digit Span 8 25      Coding 12 75  Delayed Memory 71 3      List Recall   (1) --- <2      List Recognition   (15) --- <2      Story Recall   (7) 7 16      Figure  Recall   () 12 75      Wechsler Adult Intelligence Scale - IV: Standard/Scaled Score Percentile  Working Memory Index 86 18      Digit Span 10 50          Digit Span Forward 10 50          Digit Span Backward 11 63          Digit Span Sequencing 9 37      Arithmetic 5 5  Processing Speed Index 120 91      Symbol Search 13 84      Coding 14 91      Neuropsychological Assessment Battery (Language Module): T-score Percentile      Naming   (31) 55 69      Verbal Fluency: T-score Percentile  Controlled Oral Word Association (F-A-S) 50 50      Semantic Fluency (Animals) 42 21      Trail Making Test: T-Score Percentile      Part A 73 99      Part B 69 97      [place] Card Sorting Test - 64: T-score Percentile      Categories 41 18      Total Errors 51 54      Perseverative Errors 50 50      Nonperseverative Errors 33 4      Conceptual Level Responses 37 10      Boston Diagnostic Aphasia Exam: Raw Score Scaled Score      Complex Ideational Material 11 9      Rating Scales         Raw Score Descriptor  Patient Health Questionnaire - 9 8 Mild  GAD-7 21 Severe      Adult Self Report Scale Raw Score Descriptor      Part A Total 32 ---      Part B Total 30 ---   Bettye Boeck. Roseanne Reno PsyD, ABN Clinical Neuropsychologist

## 2020-01-30 ENCOUNTER — Encounter: Payer: Self-pay | Admitting: Counselor

## 2020-01-30 ENCOUNTER — Other Ambulatory Visit: Payer: Self-pay

## 2020-01-30 ENCOUNTER — Ambulatory Visit (INDEPENDENT_AMBULATORY_CARE_PROVIDER_SITE_OTHER): Payer: BC Managed Care – PPO | Admitting: Counselor

## 2020-01-30 DIAGNOSIS — R4189 Other symptoms and signs involving cognitive functions and awareness: Secondary | ICD-10-CM

## 2020-01-30 DIAGNOSIS — F909 Attention-deficit hyperactivity disorder, unspecified type: Secondary | ICD-10-CM | POA: Diagnosis not present

## 2020-01-30 DIAGNOSIS — F418 Other specified anxiety disorders: Secondary | ICD-10-CM | POA: Diagnosis not present

## 2020-01-30 NOTE — Progress Notes (Signed)
   [facility]  Telemedicine statement:  I discussed the limitations of neuropsychological care via telemedicine and the availability of in person appointments. The patient expressed understanding and agreed to proceed. The patient was verified with two identifiers.  The visit modality was: telephonic The patient location was: home The provider location was: office  The following individuals participated: Stephanie Fisher  Feedback Note: I met with Stephanie Fisher to review the findings resulting from her neuropsychological evaluation. Since the last appointment, she has been about the same. She had an appointment with her psychiatrist and was prescribed adderall but she isn't sure if she wants to take it because she previously found vyvanse triggering. We had a long discussion regarding her cognitive symptoms, which I think are best explained by acute on chronic issues including ADHD with numerous psychosocial changes, grief, depression/anxiety, and also perhaps menopause. I think the likelihood of a progressive condition is low and her CT was clean with respect to vascular disease. She is also reporting some trauma-like symptomatology and feeling dissociated, which I explained can put her at risk for "cogniform" disorder. Psychotherapy, both targeting her ADHD and her recent life stressors and mood, is likely to be helpful. She presented as appreciative of the encounter and reassured.    Current Medications and Medical History   No current outpatient medications on file.   No current facility-administered medications for this visit.    Patient Active Problem List   Diagnosis Date Noted  . Attention deficit hyperactivity disorder (ADHD) 01/30/2020    Mental Status and Behavioral Observations  Stephanie Fisher was available at the prespecified time for this telephonic appointment and was alert and generally oriented (orientation not formally assessed).  Speech was normal in rate, rhythm, volume, and prosody. Self-reported mood was "ok" and affect as assessed by vocal quality was anxious. Thought process was a bit scattered, as at the previous appointments and thought content was appropriate to the topics discussed. There were no safety concerns identified at today's encounter, such as thoughts of harming self or others.   Plan  Feedback provided regarding the patient's neuropsychological evaluation. I think her symptoms are due to preexisting ADHD with affective issues, grief due to the loss of her child, and perhaps also menopause superimposed. She is already working with a psychiatrist on medications and I made a referral for psychology. She is interested in treatment of her underlying grief and affective issues but also behavioral management of ADHD, which I explained may not be available. I believe [facility] offers that service and encouraged her to follow up with them Stephanie Fisher was encouraged to contact me if any questions arise or if further follow up is desired.   Viviano Simas Nicole Kindred, PsyD, ABN Clinical Neuropsychologist  Service(s) Provided at This Encounter: 37 minutes [phone]; Psychotherapy with patient/family)

## 2020-01-30 NOTE — Patient Instructions (Signed)
Your performance and presentation on assessment were consistent mainly with executive control problems. Executive control is a higher order cognitive ability involved in regulating other cognitive resources. Much like the conductor of an orchestra coordinates multiple instruments to make music, executive capacities coordinate other lower-order skills (e.g., movement, language, attention) to form complex human behaviors. Individuals with executive control problems are often capable of doing most of the things they did before they were having problems, but they may not do so as effortlessly, efficiently, and consistently. These difficulties often manifest as problems tracking information, multitasking, and paying attention. Executive control problems often result in cognitive inefficiency and can present as "memory problems," because they decrease encoding and spontaneous retrieval of information.   In your case, I do think that you have a convincing history of ADHD and your test performance is also suggestive of the types of deficits we expect to see in individuals with the condition. Accordingly, I would suggest that you consider treatment. There are many ways to treat ADHD and you reported that medication did not agree with you in the past. You might consider trying behavioral treatment, which could help you deal with your symptoms as they exist in your life, through problem solving, learning techniques to manage them, and also minimizing the chance that they occur in the first place by structuring the environment in a way that supports desired behavior.   You reported very high levels of anxiety and a mild level of depressive symptoms. Depression and anxiety can be extremely undermining to cognitive performance. This is doubly true if you have depression and anxiety in the setting of preexisting ADHD.   ADHD is a very heterogeneous condition that affects different people in different ways. Individuals with  ADHD frequently struggle with mood issues, anxiety, substance abuse, and in general finding their way in life. It sounds like you have some regrets about decisions you have made and do not have a clear sense of what you want for yourself and how to get there. These types of issues cannot be effectively treated with medication and therefore, I would suggest strongly that you consider psychotherapy.   For problems with attention and concentration, consider the following recommendations:  Build routines into your day and stick to them, doing the same thing at the same time helps your body get into a rhythm that makes it harder to forget something you need to do.   Use sticky notes, reminders, a calendar, or your smart phone to provide yourself with reminders, to do lists, and help track appointments.   When you need to do work for school or other things, create an environment that is conducive to that work. This may include putting electronic devices that can be distracting outside the room and working in an area that is quiet and free from distractions.   Break things down into smaller steps to help get started and stop yourself from feeling overwhelmed.   Plan breaks throughout the day where you can get up and move even if it's for just a few minutes at a time.   Focus your attention on only one thing and avoid multitasking. Although some people are better at it than others, nobody's task performance is as good as it could be when they are alternating attention between two different tasks.   Stay mindful throughout your day and monitor whether you are feeling overwhelmed or disorganized to identify problems before they happen.   Avoid working under time pressure when you may be  more liable to make mistakes.   As for the possibility of another cause for your cognitive problems, I think it is most likely that grief over your son's death, anxiety, and depression in the setting of fairly significant  untreated ADHD are the most likely cause for your problems. I would like to reassure you that there are no signs/symptoms particularly concerning for the presence of an underlying condition such as dementia. Dementia is also exceedingly rare in individuals your age.

## 2020-02-09 ENCOUNTER — Encounter: Payer: Self-pay | Admitting: Counselor

## 2020-02-16 ENCOUNTER — Encounter: Payer: BC Managed Care – PPO | Admitting: Counselor

## 2020-02-16 NOTE — Progress Notes (Signed)
NEUROPSYCHOLOGICAL EVALUATION Stephanie Fisher  Patient Name: Stephanie Fisher MRN: [id number] Date of Birth: 1966/05/10 Age: 54 y.o. Education: 14 years  Clinical Impressions  Stephanie Fisher is a 20 y.o., right-hand dominant, single woman with a history of depersonalization/dissociation after waking up last Summe and subjective changes with attention, concentration, and organizing herself since that time. Her main symptoms involve difficulties with organization, and planning. She also had significant psychosocial stressors around that time (her son died of a drug overdose last year). She has a history that is fairly convincing for ADHD with some difficulties focusing/achieving in school and she consulted with [facility] several years ago who thought that she did have the condition. She was tested related to concerns about a learning problem in school but wasn't aware of the specifics. She also has a history of GAD, panic disorder, and unspecified mood disorder and is seeing Krystal [facility] at the [facility] for med management. She is not in counseling. She has no neuroimaging.   On neuropsychological assessment, Stephanie Fisher's performance was consistent with high average premorbid ability and low average overall cognitive ability, which is perhaps just a bit lower than expected for her. She had a hard time on measures of verbal memory, weak performance with respect to working memory, and low scores on select executive measures. On the other hand, she performed in the superior range on measures of processing efficiency and on one other challenging executive measure. Her cognitive profile can be summed up as primarily reflecting difficulties with executive control and controlled processing of information, manifesting in inconsistent performance. She screened in the mild range for depressive symptoms and in the severe range for anxiety symptoms and presented as extremely  anxious, particularly on the day she completed testing. She endorsed symptoms highly consistent with ADHD on a self-rating scale and I think that she likely does have the condition.   Stephanie Fisher is thus demonstrating a cognitive profile that fits very well with her history of ADHD and there is also likely a significant interfering effect from her current anxiety level, which is pronounced. I think there is a low likelihood that there is an underlying neurologic cause beyond that. She related a history of difficulties identifying a direction in life, many unrealized hopes and dreams, and would likely be an excellent candidate for psychotherapy. She would also benefit from behavioral treatment of her ADHD.   Diagnostic Impressions: Attention-deficit/hyperactivity disorder, combined type Generalized anxiety disorder (by history)   Recommendations to be discussed with patient  Your performance and presentation on assessment were consistent mainly with executive control problems. Executive control is a higher order cognitive ability involved in regulating other cognitive resources. Much like the conductor of an orchestra coordinates multiple instruments to make music, executive capacities coordinate other lower-order skills (e.g., movement, language, attention) to form complex human behaviors. Individuals with executive control problems are often capable of doing most of the things they did before they were having problems, but they may not do so as effortlessly, efficiently, and consistently. These difficulties often manifest as problems tracking information, multitasking, and paying attention. Executive control problems often result in cognitive inefficiency and can present as "memory problems," because they decrease encoding and spontaneous retrieval of information.   In your case, I do think that you have a convincing history of ADHD and your test performance is also suggestive of the types of deficits we  expect to see in individuals with the condition. Accordingly, I would suggest that  you consider treatment. There are many ways to treat ADHD and you reported that medication did not agree with you in the past. You might consider trying behavioral treatment, which could help you deal with your symptoms as they exist in your life, through problem solving, learning techniques to manage them, and also minimizing the chance that they occur in the first place by structuring the environment in a way that supports desired behavior.   You reported very high levels of anxiety and a mild level of depressive symptoms. Depression and anxiety can be extremely undermining to cognitive performance. This is doubly true if you have depression and anxiety in the setting of preexisting ADHD.   ADHD is a very heterogeneous condition that affects different people in different ways. Individuals with ADHD frequently struggle with mood issues, anxiety, substance abuse, and in general finding their way in life. It sounds like you have some regrets about decisions you have made and do not have a clear sense of what you want for yourself and how to get there. These types of issues cannot be effectively treated with medication and therefore, I would suggest strongly that you consider psychotherapy.   For problems with attention and concentration, consider the following recommendations:  Build routines into your day and stick to them, doing the same thing at the same time helps your body get into a rhythm that makes it harder to forget something you need to do.   Use sticky notes, reminders, a calendar, or your smart phone to provide yourself with reminders, to do lists, and help track appointments.   When you need to do work for school or other things, create an environment that is conducive to that work. This may include putting electronic devices that can be distracting outside the room and working in an area that is quiet and  free from distractions.   Break things down into smaller steps to help get started and stop yourself from feeling overwhelmed.   Plan breaks throughout the day where you can get up and move even if it's for just a few minutes at a time.   Focus your attention on only one thing and avoid multitasking. Although some people are better at it than others, nobody's task performance is as good as it could be when they are alternating attention between two different tasks.   Stay mindful throughout your day and monitor whether you are feeling overwhelmed or disorganized to identify problems before they happen.   Avoid working under time pressure when you may be more liable to make mistakes.   As for the possibility of another cause for your cognitive problems, I think it is most likely that grief over your son's death, anxiety, and depression in the setting of fairly significant untreated ADHD are the most likely cause for your problems. I would like to reassure you that there are no signs/symptoms particularly concerning for the presence of an underlying condition such as dementia. Dementia is also exceedingly rare in individuals your age.   Test Findings  Test scores are summarized in additional documentation associated with this encounter. Test scores are relative to age, gender, and educational history as available and appropriate. There were no concerns about performance validity as all findings fell within normal expectations.   General Intellectual Functioning/Achievement:  Performance on single-word reading was high average, which presents as a reasonable standard of comparison for Ms. Musick's cognitive test performance.   Attention and Processing Efficiency: Performance on indicators of attention and  working memory was variable, with an overall low average score on the Working Memory Index of the WAIS-IV. She achieved average scores on measures of digit repetition forward, backward, and digit  resequencing in ascending order. By contrast, she demonstrated unusually low performance on a measure involving solving of arithmetical word problems without paper and pencil. She stated that she had learning difficulties and there may be a component of that in her test performance here.   With respect to processing speed, Ms. Jaffee's performance was very good and she scored in the superior range on the Processing Speed Index of the WAIS-IV. Performance on one timed number-symbol coding measure was toward the top of the average range whereas she achieved a superior score on a different analogous measure. She scored in the high average range on a symbol-matching to sample test emphasizing efficient visual scanning and efficient visual matching. Simple numeric sequencing was very superior.   Language: Performance was within normal limits on language measures with errorless visual object confrontation naming and mainly average verbal fluency. Her semantic fluency for animals was low average.   Visuospatial Function: Performance on visuospatial and constructional measures fell at a reasonable, high average level. Copy of a line drawing was almost errorless and her judgment of angular line orientations was high average.   Learning and Memory: Performance on measures of learning and memory suggested notable executive control issues, presenting with difficulties learning and retaining unstructured verbal information and inconsistent delayed recall performance. She seemed to do better with visual information.   In the verbal realm, Ms. Dingwall scored at a low average level overall although her memory for a short story was average and her memory for a 10-item word list was extremely low across four learning trials. Following a standard delay, she only recalled one word from the word list and recognition cuing was not helpful in increasing her performance with an extremely low score when choosing target words  from amongst distractor items. She performed better recalling the story, with a low average score.   In the visual realm, delayed recall for a modestly complex geometric figure was average.   Executive Functions: Performance across the test battery is suggestive of executive control issues and her performance on dedicated measures within this domain was variable. She had an unusually low score with respect to errors on the [employer] and her number of conceptual responses was weak. By contrast, she performed at a superior level when alternating sequencing numbers and letters of the alphabet. Reasoning with verbal information was average on the Complex Ideational Material. Generation of words in response to the letter prompts F-A-S was average.   Rating Scale(s): Ms. Beitzel reported severe levels of anxiety symptoms and mild yet still clinically significant levels of depressive symptoms, including feeling bad about herself or that she is a failure more than half the days over the past two weeks. She responded to items in a manner highly consistent with ADHD on the Adult ADHD Self Report Scale. In the context of her clinical history, the clinical picture is quite compelling for ADHD.   Bettye Boeck Roseanne Reno PsyD, ABN Clinical Neuropsychologist

## 2020-03-15 ENCOUNTER — Ambulatory Visit: Payer: Self-pay | Admitting: Neurology

## 2020-03-21 ENCOUNTER — Other Ambulatory Visit: Payer: Self-pay | Admitting: Certified Nurse Midwife

## 2020-03-21 DIAGNOSIS — R1310 Dysphagia, unspecified: Secondary | ICD-10-CM

## 2020-03-21 DIAGNOSIS — E049 Nontoxic goiter, unspecified: Secondary | ICD-10-CM

## 2020-03-27 ENCOUNTER — Ambulatory Visit
Admission: RE | Admit: 2020-03-27 | Discharge: 2020-03-27 | Disposition: A | Discharge status: Home / Self Care | Payer: BC Managed Care – PPO | Source: Ambulatory Visit | Attending: Certified Nurse Midwife | Admitting: Certified Nurse Midwife

## 2020-03-27 DIAGNOSIS — E049 Nontoxic goiter, unspecified: Secondary | ICD-10-CM

## 2020-03-27 DIAGNOSIS — R1310 Dysphagia, unspecified: Secondary | ICD-10-CM

## 2020-03-29 ENCOUNTER — Other Ambulatory Visit: Payer: BC Managed Care – PPO

## 2020-04-05 ENCOUNTER — Other Ambulatory Visit: Payer: Self-pay | Admitting: Family Medicine

## 2020-04-05 ENCOUNTER — Other Ambulatory Visit (HOSPITAL_COMMUNITY): Payer: Self-pay | Admitting: Family Medicine

## 2020-04-05 DIAGNOSIS — E039 Hypothyroidism, unspecified: Secondary | ICD-10-CM

## 2020-04-17 ENCOUNTER — Encounter (HOSPITAL_COMMUNITY)
Admission: RE | Admit: 2020-04-17 | Discharge: 2020-04-17 | Disposition: A | Discharge status: Home / Self Care | Payer: BC Managed Care – PPO | Source: Ambulatory Visit | Attending: Family Medicine | Admitting: Family Medicine

## 2020-04-17 ENCOUNTER — Other Ambulatory Visit: Payer: Self-pay

## 2020-04-17 DIAGNOSIS — E039 Hypothyroidism, unspecified: Secondary | ICD-10-CM | POA: Insufficient documentation

## 2020-04-17 MED ORDER — SODIUM IODIDE I-123 7.4 MBQ CAPS
428.0000 | ORAL_CAPSULE | Freq: Once | ORAL | Status: AC
  Administered 2020-04-17: 428 via ORAL

## 2020-04-18 ENCOUNTER — Encounter (HOSPITAL_COMMUNITY)
Admission: RE | Admit: 2020-04-18 | Discharge: 2020-04-18 | Disposition: A | Discharge status: Home / Self Care | Payer: BC Managed Care – PPO | Source: Ambulatory Visit | Attending: Family Medicine | Admitting: Family Medicine

## 2020-04-23 ENCOUNTER — Other Ambulatory Visit: Payer: Self-pay | Admitting: Family Medicine

## 2020-05-10 ENCOUNTER — Ambulatory Visit (INDEPENDENT_AMBULATORY_CARE_PROVIDER_SITE_OTHER): Payer: BC Managed Care – PPO | Admitting: Endocrinology

## 2020-05-10 ENCOUNTER — Other Ambulatory Visit: Payer: Self-pay

## 2020-05-10 ENCOUNTER — Encounter: Payer: Self-pay | Admitting: Endocrinology

## 2020-05-10 DIAGNOSIS — F32A Depression, unspecified: Secondary | ICD-10-CM | POA: Insufficient documentation

## 2020-05-10 DIAGNOSIS — E0590 Thyrotoxicosis, unspecified without thyrotoxic crisis or storm: Secondary | ICD-10-CM | POA: Insufficient documentation

## 2020-05-10 DIAGNOSIS — F329 Major depressive disorder, single episode, unspecified: Secondary | ICD-10-CM | POA: Insufficient documentation

## 2020-05-10 DIAGNOSIS — O09529 Supervision of elderly multigravida, unspecified trimester: Secondary | ICD-10-CM | POA: Insufficient documentation

## 2020-05-10 DIAGNOSIS — F419 Anxiety disorder, unspecified: Secondary | ICD-10-CM | POA: Insufficient documentation

## 2020-05-10 LAB — TSH: TSH: <0.01 u[IU]/mL — ABNORMAL LOW (ref 0.35–4.50)

## 2020-05-10 LAB — T4, FREE: Free T4: 0.59 ng/dL — ABNORMAL LOW (ref 0.60–1.60)

## 2020-05-10 NOTE — Patient Instructions (Addendum)
Blood tests are requested for you today.  We'll let you know about the results.  Please consider the treatment options we discussed, and let me know your choice.

## 2020-05-10 NOTE — Progress Notes (Signed)
Subjective:    Patient ID: Stephanie Fisher, female    DOB: June 21, 1966, 54 y.o.   MRN: [id number]  HPI Pt is referred by Marlinda Mike, CNM, for hyperthyroidism.  Pt reports he was dx'ed with hyperthyroidism in 2021.  In 9/21, she was rx'ed tapazole.  It was increased to 10 mg qd, but she developed anxiety on this, so it was decreased to 5 mg BID.  she has never had XRT to the anterior neck, or thyroid surgery.  she has never had thyroid imaging.  she does not consume kelp or any other non-prescribed thyroid medication.  she has never been on amiodarone.  She reports sxs of difficulty with concentration, anxiety, weight loss (15 lbs x a few mos), palpitations, tremor, muscle weakness, and doe.   Past Medical History:  Diagnosis Date  . ADHD (attention deficit hyperactivity disorder)   . Anxiety   . Depression     No past surgical history on file.  Social History   Socioeconomic History  . Marital status: Single    Spouse name: Not on file  . Number of children: Not on file  . Years of education: Not on file  . Highest education level: Not on file  Occupational History  . Not on file  Tobacco Use  . Smoking status: Never Smoker  . Smokeless tobacco: Never Used  Substance and Sexual Activity  . Alcohol use: No  . Drug use: No  . Sexual activity: Yes  Other Topics Concern  . Not on file  Social History Narrative  . Not on file   Social Determinants of Health   Financial Resource Strain:   . Difficulty of Paying Living Expenses: Not on file  Food Insecurity:   . Worried About Programme researcher, broadcasting/film/video in the Last Year: Not on file  . Ran Out of Food in the Last Year: Not on file  Transportation Needs:   . Lack of Transportation (Medical): Not on file  . Lack of Transportation (Non-Medical): Not on file  Physical Activity:   . Days of Exercise per Week: Not on file  . Minutes of Exercise per Session: Not on file  Stress:   . Feeling of Stress : Not on file  Social Connections:    . Frequency of Communication with Friends and Family: Not on file  . Frequency of Social Gatherings with Friends and Family: Not on file  . Attends Religious Services: Not on file  . Active Member of Clubs or Organizations: Not on file  . Attends Banker Meetings: Not on file  . Marital Status: Not on file  Intimate Partner Violence:   . Fear of Current or Ex-Partner: Not on file  . Emotionally Abused: Not on file  . Physically Abused: Not on file  . Sexually Abused: Not on file    Current Outpatient Medications on File Prior to Visit  Medication Sig Dispense Refill  . methimazole (TAPAZOLE) 5 MG tablet Take by mouth.     No current facility-administered medications on file prior to visit.    No Known Allergies  Family History  Adopted: Yes    BP 120/80   Pulse 63   Ht 5' 5.5" (1.664 m)   Wt 111 lb (50.3 kg)   SpO2 98%   BMI 18.19 kg/m   Review of Systems denies excessive diaphoresis, and heat intolerance.      Objective:   Physical Exam VS: see vs page GEN: no distress HEAD: head: no  deformity eyes: no periorbital swelling, no proptosis external nose and ears are normal NECK: supple, thyroid is not enlarged CHEST WALL: no deformity LUNGS: clear to auscultation CV: reg rate and rhythm, no murmur.  MUSCULOSKELETAL: gait is normal and steady.   EXTEMITIES: no deformity.  no leg edema NEURO:  cn 2-12 grossly intact.   readily moves all 4's.  sensation is intact to touch on all 4's.  Slight tremor of the hands.   SKIN:  Normal texture and temperature.  No rash or suspicious lesion is visible.  Not diaphoretic NODES:  None palpable at the neck PSYCH: alert, well-oriented.  Does not appear anxious nor depressed.    Markedly elevated 4 hour and 24 hour radio iodine uptakes. Findings consistent with Graves disease.  [place]: Thyroid parenchyma is heterogeneous and very hypervascular without focal nodules. Overall thyroid gland volume is within normal  limits. Increased vascularity can be suggestive of thyroiditis.  Lab Results  Component Value Date   TSH <0.01 Repeated and verified X2. (L) 05/10/2020    records are requested.     Assessment & Plan:  Hyperthyroidism, new to me.  Due to Grave's Dz.  Uncontrolled.  Please continue the same Tapazole.

## 2020-06-04 ENCOUNTER — Telehealth: Payer: Self-pay

## 2020-06-04 NOTE — Telephone Encounter (Signed)
I am not taking new pts at the moment

## 2020-06-04 NOTE — Telephone Encounter (Signed)
OK 

## 2020-06-04 NOTE — Telephone Encounter (Signed)
Please see below and advise the front desk

## 2020-06-04 NOTE — Telephone Encounter (Signed)
New message    The patient was seen as new patient by Dr. Everardo All on 10.22.2021 did not feel like anything was accomplish.   Asking if Dr. Elvera Lennox will accepted her as existing patient from Dr. Everardo All.

## 2020-06-06 NOTE — Telephone Encounter (Signed)
Patient would like to see Dr Lonzo Cloud as a new patient, please advise.

## 2020-06-10 ENCOUNTER — Telehealth: Payer: Self-pay | Admitting: Endocrinology

## 2020-06-10 NOTE — Telephone Encounter (Signed)
Pt called for lab results from visit with Everardo All. Please call pt [phone]

## 2020-06-11 NOTE — Telephone Encounter (Signed)
Pt will need to see me for an office visit. I have openings today in [place] or she can come tomorrow at [facility].

## 2020-06-11 NOTE — Telephone Encounter (Signed)
Called patient, scheduled for 8:50 AM after speaking with Johny Drilling, CMA who approved time.   Thank you!

## 2020-06-11 NOTE — Telephone Encounter (Signed)
Patient call was transferred to me to discuss, as patient was concerned. I did go over blood work from October- I did see where patient wanted to switch providers to [facility]. Patient would like for me to send a message to her, as he wanted her seen back in 1 month, but she feels that her levels are worse now and is asking if we can recheck blood work. She has been having a lot of increase palpitations, increased sweating, the depression has increased, and she feels that she is 'losing her mind'. I did suggest that she contact her PCP to discuss her anxiety issue as well as this can have some of what is experiencing. Patient did mention going to the ER today as she was panicking and felt she needed help. I did advise her that if that is what she would like to do for immediate help she should go there. Patient verbalized understanding and stated she would like a message sent to [facility] to make her aware and to see if she should repeat blood work, and get her recommendations. I did advise she had no seen her before but would do as she asked and send a message.   Thank you!

## 2020-06-12 ENCOUNTER — Ambulatory Visit: Payer: BC Managed Care – PPO | Admitting: Internal Medicine

## 2020-06-12 NOTE — Progress Notes (Deleted)
Name: Stephanie Fisher  MRN/ DOB: [id number], [birth year]    Age/ Sex: 54 y.o., female     PCP: Dois Davenport, MD   Reason for Endocrinology Evaluation: ***     Initial Endocrinology Clinic Visit: 05/10/2020    PATIENT IDENTIFIER: Stephanie Fisher is a 49 y.o., female with a past medical history of ***. She has followed with [facility] since 05/10/2020 for consultative assistance with management of her hyperthyroidism  HISTORICAL SUMMARY: The patient was first diagnosed with Hyperthyroidism secondary to Graves' disease in 03/2020. She has been on methimazole since 04/2020   SUBJECTIVE:    Today (06/12/2020):  Ms. Stambaugh is here for a follow up on hyperthyroidism secondary to Graves' Disease    HISTORY:  Past Medical History:  Past Medical History:  Diagnosis Date  . ADHD (attention deficit hyperactivity disorder)   . Anxiety   . Depression      Past Surgical History: No past surgical history on file.  Social History:  reports that she has never smoked. She has never used smokeless tobacco. She reports that she does not drink alcohol and does not use drugs. Family History:  Family History  Adopted: Yes      HOME MEDICATIONS: Allergies as of 06/12/2020   No Known Allergies     Medication List       Accurate as of June 12, 2020  7:15 AM. If you have any questions, ask your nurse or doctor.        methimazole 5 MG tablet Commonly known as: TAPAZOLE Take by mouth.         OBJECTIVE:   PHYSICAL EXAM: VS: There were no vitals taken for this visit.   EXAM: General: Pt appears well and is in NAD  Hydration: Well-hydrated with moist mucous membranes and good skin turgor  Eyes: External eye exam normal without stare, lid lag or exophthalmos.  EOM intact.  PERRL.  Ears, Nose, Throat: Hearing: Grossly intact bilaterally Dental: Good dentition  Throat: Clear without mass, erythema or exudate  Neck: General: Supple without  adenopathy. Thyroid: Thyroid size normal.  No goiter or nodules appreciated. No thyroid bruit.  Lungs: Clear with good BS bilat with no rales, rhonchi, or wheezes  Heart: Auscultation: RRR.  Abdomen: Normoactive bowel sounds, soft, nontender, without masses or organomegaly palpable  Extremities: Gait and station: Normal gait  Digits and nails: No clubbing, cyanosis, petechiae, or nodes Head and neck: Normal alignment and mobility BL UE: Normal ROM and strength. BL LE: No pretibial edema normal ROM and strength.  Skin: Hair: Texture and amount normal with gender appropriate distribution Skin Inspection: No rashes, acanthosis nigricans/skin tags. No lipohypertrophy Skin Palpation: Skin temperature, texture, and thickness normal to palpation  Neuro: Cranial nerves: II - XII grossly intact  Cerebellar: Normal coordination and movement; no tremor Motor: Normal strength throughout DTRs: 2+ and symmetric in UE without delay in relaxation phase  Mental Status: Judgment, insight: Intact Orientation: Oriented to time, place, and person Memory: Intact for recent and remote events Mood and affect: No depression, anxiety, or agitation     DATA REVIEWED: ***  Thyroid Uptake and Scan 04/18/2020  Homogeneous tracer distribution in both thyroid lobes.  No focal areas of increased or decreased tracer localization.  4 hour I-123 uptake = 45.2% (normal 5-20%)  24 hour I-123 uptake = 79.6% (normal 10-30%)  IMPRESSION: Normal thyroid scan.  Markedly elevated 4 hour and 24 hour radio iodine uptakes.  Findings consistent with  Graves disease.  Thyroid ultrasound 03/27/2020  No discrete nodules are seen within the thyroid gland. The thyroid parenchyma is very hypervascular bilaterally. No abnormal lymph nodes identified.  IMPRESSION: Thyroid parenchyma is heterogeneous and very hypervascular without focal nodules. Overall thyroid gland volume is within normal limits. Increased  vascularity can be suggestive of thyroiditis.  ASSESSMENT / PLAN / RECOMMENDATIONS:   1. Hyperthyroidism secondary to Graves' Disease:      Medications   2. Graves' Disease:   Signed electronically by: Lyndle Herrlich, MD  [facility] Endocrinology  [facility] [address] [place], [place] [address] Phone: [phone] FAX: [phone]      CC: Dois Davenport, MD [address] [place] [address] Phone: [phone]  Fax: [phone]   Return to Endocrinology clinic as below: Future Appointments  Date Time Provider Department Center  06/12/2020  8:50 AM Angi Goodell, Konrad Dolores, MD [facility] None  08/02/2020 10:10 AM Elenna Spratling, Konrad Dolores, MD [facility] None

## 2020-06-17 ENCOUNTER — Encounter: Payer: Self-pay | Admitting: Internal Medicine

## 2020-06-17 ENCOUNTER — Other Ambulatory Visit: Payer: Self-pay

## 2020-06-17 ENCOUNTER — Ambulatory Visit: Payer: BC Managed Care – PPO | Admitting: Internal Medicine

## 2020-06-17 VITALS — BP 114/62 | HR 71 | Ht 65.5 in | Wt 117.5 lb

## 2020-06-17 DIAGNOSIS — E0500 Thyrotoxicosis with diffuse goiter without thyrotoxic crisis or storm: Secondary | ICD-10-CM | POA: Diagnosis not present

## 2020-06-17 DIAGNOSIS — E0590 Thyrotoxicosis, unspecified without thyrotoxic crisis or storm: Secondary | ICD-10-CM

## 2020-06-17 LAB — TSH: TSH: 12.65 u[IU]/mL — ABNORMAL HIGH (ref 0.35–4.50)

## 2020-06-17 NOTE — Progress Notes (Signed)
Name: Stephanie Fisher  MRN/ DOB: [id number], [birth year]    Age/ Sex: 54 y.o., female     PCP: Dois Davenport, MD   Reason for Endocrinology Evaluation: Hyperthyroidism      Initial Endocrinology Clinic Visit: 05/10/2020    PATIENT IDENTIFIER: Stephanie Fisher is a 55 y.o., female with unremarkable  past medical history . She has followed with [facility] since 05/10/2020 for consultative assistance with management of her hyperthyroidism  HISTORICAL SUMMARY: The patient was first diagnosed with Hyperthyroidism secondary to Graves' disease in 03/2020. A thyroid uptake and scan ( 03/2020 ) demonstrated a 24 hour I-123 uptake = 79.6%. Thyroid ultrasound did not reveal any nodules.   She had a loss summer 2020 and has cognitive issues as well as memory loss.   She has been on methimazole since 03/2020  She is adopted  SUBJECTIVE:    Today (06/17/2020):  Ms. Ramnath is here for a follow up on hyperthyroidism secondary to Graves' Disease  Weight back to baseline.   She continues with anxiety  Denies diarrhea  Has palpitations    Wakes up several times through the night with body tension.   She is currently seeing a psychiatrist was started on Prozac but that exacerbated her night symptoms and eventually stopped it.    Methimazole 5 mg, BID   HISTORY:  Past Medical History:  Past Medical History:  Diagnosis Date   ADHD (attention deficit hyperactivity disorder)    Anxiety    Depression     Past Surgical History: No past surgical history on file.  Social History:  reports that she has never smoked. She has never used smokeless tobacco. She reports that she does not drink alcohol and does not use drugs. Family History:  Family History  Adopted: Yes     HOME MEDICATIONS: Allergies as of 06/17/2020   No Known Allergies     Medication List       Accurate as of June 17, 2020  2:21 PM. If you have any questions, ask your nurse or doctor.         methimazole 5 MG tablet Commonly known as: TAPAZOLE Take by mouth.         OBJECTIVE:   PHYSICAL EXAM: VS: BP 114/62    Pulse 71    Ht 5' 5.5" (1.664 m)    Wt 117 lb 8 oz (53.3 kg)    LMP  (LMP Unknown) Comment: over 1 year   SpO2 97%    BMI 19.26 kg/m    EXAM: General: Pt appears well and is in NAD  Neck: General: Supple without adenopathy. Thyroid: Thyroid size normal.  No goiter or nodules appreciated.   Lungs: Clear with good BS bilat with no rales, rhonchi, or wheezes  Heart: Auscultation: RRR.  Abdomen: Normoactive bowel sounds, soft, nontender, without masses or organomegaly palpable  Extremities:  BL LE: No pretibial edema normal ROM and strength.  Skin: Hair: Texture and amount normal with gender appropriate distribution Skin Inspection: No rashes Skin Palpation: Skin temperature, texture, and thickness normal to palpation  Neuro: Cranial nerves: II - XII grossly intact  Motor: Normal strength throughout DTRs: 2+ and symmetric in UE without delay in relaxation phase  Mental Status: Judgment, insight: Intact Orientation: Oriented to time, place, and person Mood and affect: No depression, anxiety, or agitation     DATA REVIEWED:  Results for HONESTEE, REVARD (MRN [id number]) as of 06/18/2020 07:33  Ref. Range 06/17/2020 14:53  TSH Latest Ref Range: 0.35 - 4.50 uIU/mL 12.65 (H)  T4,Free(Direct) Latest Ref Range: 0.60 - 1.60 ng/dL 2.59 (L)     Thyroid Uptake and Scan 04/18/2020  Homogeneous tracer distribution in both thyroid lobes.  No focal areas of increased or decreased tracer localization.  4 hour I-123 uptake = 45.2% (normal 5-20%)  24 hour I-123 uptake = 79.6% (normal 10-30%)  IMPRESSION: Normal thyroid scan.  Markedly elevated 4 hour and 24 hour radio iodine uptakes.  Findings consistent with Graves disease.  Thyroid ultrasound 03/27/2020  No discrete nodules are seen within the thyroid gland. The thyroid parenchyma is very  hypervascular bilaterally. No abnormal lymph nodes identified.  IMPRESSION: Thyroid parenchyma is heterogeneous and very hypervascular without focal nodules. Overall thyroid gland volume is within normal limits. Increased vascularity can be suggestive of thyroiditis.  ASSESSMENT / PLAN / RECOMMENDATIONS:   1. Hyperthyroidism secondary to Graves' Disease:   - Pt is clinically euthyroid except for anxiety and insomnia  - She is tolerating methimazole without side effects - Repeat TFT's today show hypothyroidism, will reduce the dose below    Medications  Decrease Methimazole 5 mg , to one tablet daily      2. Graves' Disease:  - No extra thyroidal manifestations of Graves' disease    F/U in 3 months  Labs in 6 weeks    Signed electronically by: Lyndle Herrlich, MD  [facility]  [facility] [address] [place], [place] [address] Phone: [phone] FAX: [phone]      CC: Dois Davenport, MD [address] [place] [address] Phone: [phone]  Fax: [phone]   Return to Endocrinology clinic as below: Future Appointments  Date Time Provider Department Center  08/02/2020 10:10 AM Arissa Fagin, Konrad Dolores, MD [facility] None

## 2020-06-17 NOTE — Patient Instructions (Signed)
-   Continue Methimazole 5 mg, 1 tablet twice daily

## 2020-06-18 DIAGNOSIS — E0500 Thyrotoxicosis with diffuse goiter without thyrotoxic crisis or storm: Secondary | ICD-10-CM | POA: Insufficient documentation

## 2020-06-18 LAB — T4, FREE: Free T4: 0.41 ng/dL — ABNORMAL LOW (ref 0.60–1.60)

## 2020-06-18 MED ORDER — METHIMAZOLE 5 MG PO TABS
5.0000 mg | ORAL_TABLET | Freq: Every day | ORAL | 1 refills | Status: DC
Start: 2020-06-18 — End: 2021-05-21

## 2020-06-25 DIAGNOSIS — F329 Major depressive disorder, single episode, unspecified: Secondary | ICD-10-CM | POA: Diagnosis not present

## 2020-06-25 DIAGNOSIS — F410 Panic disorder [episodic paroxysmal anxiety] without agoraphobia: Secondary | ICD-10-CM | POA: Diagnosis not present

## 2020-06-25 DIAGNOSIS — F4325 Adjustment disorder with mixed disturbance of emotions and conduct: Secondary | ICD-10-CM | POA: Diagnosis not present

## 2020-07-03 ENCOUNTER — Other Ambulatory Visit: Payer: Self-pay

## 2020-07-03 ENCOUNTER — Emergency Department (HOSPITAL_COMMUNITY)
Admission: EM | Admit: 2020-07-03 | Discharge: 2020-07-03 | Disposition: A | Discharge status: Home / Self Care | Payer: BC Managed Care – PPO | Attending: Emergency Medicine | Admitting: Emergency Medicine

## 2020-07-03 ENCOUNTER — Encounter (HOSPITAL_COMMUNITY): Payer: Self-pay

## 2020-07-03 DIAGNOSIS — F419 Anxiety disorder, unspecified: Secondary | ICD-10-CM | POA: Insufficient documentation

## 2020-07-03 DIAGNOSIS — F32A Depression, unspecified: Secondary | ICD-10-CM | POA: Insufficient documentation

## 2020-07-03 DIAGNOSIS — E039 Hypothyroidism, unspecified: Secondary | ICD-10-CM | POA: Insufficient documentation

## 2020-07-03 DIAGNOSIS — G4700 Insomnia, unspecified: Secondary | ICD-10-CM | POA: Insufficient documentation

## 2020-07-03 DIAGNOSIS — F99 Mental disorder, not otherwise specified: Secondary | ICD-10-CM | POA: Diagnosis not present

## 2020-07-03 NOTE — ED Provider Notes (Signed)
[facility]   CSN: [id number] Arrival date & time: 07/03/20  6644     History Chief Complaint  Patient presents with  . Mental Health Problem    Stephanie Fisher is a 54 y.o. female.  54 year old female who presents complaining of worsening depression.  Denies any SI or HI.  Also has had some increased anxiety as well 2.  Has been seen by endocrinologist recently and was found to have low thyroid from overtreatment of her hypothyroidism.  She is also placed on Prozac recently.  She notes some insomnia.  Cannot identify any particular stressors.  Has had neurocognitive testing in the past she states including a head CT.  States that she is very frustrated with her current situation.        Past Medical History:  Diagnosis Date  . ADHD (attention deficit hyperactivity disorder)   . Anxiety   . Depression     Patient Active Problem List   Diagnosis Date Noted  . Graves disease 06/18/2020  . Anxiety 05/10/2020  . Depressive disorder 05/10/2020  . Hyperthyroidism 05/10/2020  . Attention deficit hyperactivity disorder (ADHD) 01/30/2020    History reviewed. No pertinent surgical history.   OB History   No obstetric history on file.     Family History  Adopted: Yes    Social History   Tobacco Use  . Smoking status: Never Smoker  . Smokeless tobacco: Never Used  Substance Use Topics  . Alcohol use: No  . Drug use: No    Home Medications Prior to Admission medications   Medication Sig Start Date End Date Taking? Authorizing Provider  methimazole (TAPAZOLE) 5 MG tablet Take 1 tablet (5 mg total) by mouth daily. 06/18/20   Shamleffer, Konrad Dolores, MD    Allergies    Patient has no known allergies.  Review of Systems   Review of Systems  All other systems reviewed and are negative.   Physical Exam Updated Vital Signs BP 106/73 (BP Location: Left Arm)   Pulse 70   Temp 98 F (36.7 C) (Oral)   Resp  16   LMP  (LMP Unknown) Comment: over 1 year  SpO2 96%   Physical Exam Vitals and nursing note reviewed.  Constitutional:      General: She is not in acute distress.    Appearance: Normal appearance. She is well-developed and well-nourished. She is not toxic-appearing.  HENT:     Head: Normocephalic and atraumatic.  Eyes:     General: Lids are normal.     Extraocular Movements: EOM normal.     Conjunctiva/sclera: Conjunctivae normal.     Pupils: Pupils are equal, round, and reactive to light.  Neck:     Thyroid: No thyroid mass.     Trachea: No tracheal deviation.  Cardiovascular:     Rate and Rhythm: Normal rate and regular rhythm.     Heart sounds: Normal heart sounds. No murmur heard. No gallop.   Pulmonary:     Effort: Pulmonary effort is normal. No respiratory distress.     Breath sounds: Normal breath sounds. No stridor. No decreased breath sounds, wheezing, rhonchi or rales.  Abdominal:     General: Bowel sounds are normal. There is no distension.     Palpations: Abdomen is soft.     Tenderness: There is no abdominal tenderness. There is no CVA tenderness or rebound.  Musculoskeletal:        General: No tenderness or edema. Normal range  of motion.     Cervical back: Normal range of motion and neck supple.  Skin:    General: Skin is warm and dry.     Findings: No abrasion or rash.  Neurological:     General: No focal deficit present.     Mental Status: She is alert and oriented to person, place, and time.     GCS: GCS eye subscore is 4. GCS verbal subscore is 5. GCS motor subscore is 6.     Cranial Nerves: No cranial nerve deficit.     Sensory: No sensory deficit.     Deep Tendon Reflexes: Strength normal.  Psychiatric:        Attention and Perception: Attention normal.        Mood and Affect: Mood is anxious.        Speech: Speech is rapid and pressured.        Behavior: Behavior normal. Behavior is cooperative.        Thought Content: Thought content does not  include homicidal or suicidal ideation.     ED Results / Procedures / Treatments   Labs (all labs ordered are listed, but only abnormal results are displayed) Labs Reviewed - No data to display  EKG None  Radiology No results found.  Procedures Procedures (including critical care time)  Medications Ordered in ED Medications - No data to display  ED Course  I have reviewed the triage vital signs and the nursing notes.  Pertinent labs & imaging results that were available during my care of the patient were reviewed by me and considered in my medical decision making (see chart for details).    MDM Rules/Calculators/A&P                          Had a long discussion with patient on her current symptoms.  She is very frustrated that she cannot seem to have an answer to her symptoms despite seen multiple specialist.  I encouraged her to follow-up with her endocrinologist and primary care doctor who is prescribing her Prozac.  She also has a therapist and encouraged her to follow-up with him to as well. Final Clinical Impression(s) / ED Diagnoses Final diagnoses:  None    Rx / DC Orders ED Discharge Orders    None       Lorre Nick, MD 07/03/20 1309

## 2020-07-03 NOTE — Discharge Instructions (Addendum)
Follow-up with your endocrinologist for your thyroid function studies, see your therapist for your depression.

## 2020-07-03 NOTE — ED Notes (Signed)
An After Visit Summary was printed and given to the patient. Discharge instructions given and no further questions at this time.  

## 2020-07-03 NOTE — ED Triage Notes (Signed)
Pt presents questioning whether she is having a mental breakdown or complications from her thyroid disease. Pt reports that she is experiencing some depression and anxiety, has been placed on medication by her MD. Pt reports she feels that she has some cognitive decline and is unsure as to whether this is related to her mental health or because of her thyroid. Pt denies any thoughts of SI/HI.

## 2020-07-09 DIAGNOSIS — F410 Panic disorder [episodic paroxysmal anxiety] without agoraphobia: Secondary | ICD-10-CM | POA: Diagnosis not present

## 2020-07-09 DIAGNOSIS — F4325 Adjustment disorder with mixed disturbance of emotions and conduct: Secondary | ICD-10-CM | POA: Diagnosis not present

## 2020-07-09 DIAGNOSIS — F329 Major depressive disorder, single episode, unspecified: Secondary | ICD-10-CM | POA: Diagnosis not present

## 2020-07-16 DIAGNOSIS — Z1211 Encounter for screening for malignant neoplasm of colon: Secondary | ICD-10-CM | POA: Diagnosis not present

## 2020-07-16 DIAGNOSIS — Z1231 Encounter for screening mammogram for malignant neoplasm of breast: Secondary | ICD-10-CM | POA: Diagnosis not present

## 2020-07-16 DIAGNOSIS — Z681 Body mass index (BMI) 19 or less, adult: Secondary | ICD-10-CM | POA: Diagnosis not present

## 2020-07-16 DIAGNOSIS — Z01419 Encounter for gynecological examination (general) (routine) without abnormal findings: Secondary | ICD-10-CM | POA: Diagnosis not present

## 2020-07-16 DIAGNOSIS — Z124 Encounter for screening for malignant neoplasm of cervix: Secondary | ICD-10-CM | POA: Diagnosis not present

## 2020-07-16 DIAGNOSIS — N951 Menopausal and female climacteric states: Secondary | ICD-10-CM | POA: Diagnosis not present

## 2020-07-24 DIAGNOSIS — F411 Generalized anxiety disorder: Secondary | ICD-10-CM | POA: Diagnosis not present

## 2020-07-24 DIAGNOSIS — F4312 Post-traumatic stress disorder, chronic: Secondary | ICD-10-CM | POA: Diagnosis not present

## 2020-07-29 ENCOUNTER — Other Ambulatory Visit: Payer: BC Managed Care – PPO

## 2020-07-30 ENCOUNTER — Other Ambulatory Visit: Payer: Self-pay

## 2020-07-30 ENCOUNTER — Other Ambulatory Visit (INDEPENDENT_AMBULATORY_CARE_PROVIDER_SITE_OTHER): Payer: BC Managed Care – PPO

## 2020-07-30 DIAGNOSIS — E0590 Thyrotoxicosis, unspecified without thyrotoxic crisis or storm: Secondary | ICD-10-CM | POA: Diagnosis not present

## 2020-07-30 LAB — T4, FREE: Free T4: 0.72 ng/dL (ref 0.60–1.60)

## 2020-07-30 LAB — TSH: TSH: 2.1 u[IU]/mL (ref 0.35–4.50)

## 2020-08-02 ENCOUNTER — Ambulatory Visit: Payer: BC Managed Care – PPO | Admitting: Internal Medicine

## 2020-08-06 DIAGNOSIS — F332 Major depressive disorder, recurrent severe without psychotic features: Secondary | ICD-10-CM | POA: Diagnosis not present

## 2020-08-13 DIAGNOSIS — R202 Paresthesia of skin: Secondary | ICD-10-CM | POA: Diagnosis not present

## 2020-08-13 DIAGNOSIS — F332 Major depressive disorder, recurrent severe without psychotic features: Secondary | ICD-10-CM | POA: Diagnosis not present

## 2020-08-14 DIAGNOSIS — F332 Major depressive disorder, recurrent severe without psychotic features: Secondary | ICD-10-CM | POA: Diagnosis not present

## 2020-08-15 DIAGNOSIS — R5383 Other fatigue: Secondary | ICD-10-CM | POA: Diagnosis not present

## 2020-08-15 DIAGNOSIS — F332 Major depressive disorder, recurrent severe without psychotic features: Secondary | ICD-10-CM | POA: Diagnosis not present

## 2020-08-15 DIAGNOSIS — F4312 Post-traumatic stress disorder, chronic: Secondary | ICD-10-CM | POA: Diagnosis not present

## 2020-08-15 DIAGNOSIS — R109 Unspecified abdominal pain: Secondary | ICD-10-CM | POA: Diagnosis not present

## 2020-08-16 DIAGNOSIS — F332 Major depressive disorder, recurrent severe without psychotic features: Secondary | ICD-10-CM | POA: Diagnosis not present

## 2020-08-16 DIAGNOSIS — F4312 Post-traumatic stress disorder, chronic: Secondary | ICD-10-CM | POA: Diagnosis not present

## 2020-08-19 DIAGNOSIS — F4312 Post-traumatic stress disorder, chronic: Secondary | ICD-10-CM | POA: Diagnosis not present

## 2020-08-19 DIAGNOSIS — F332 Major depressive disorder, recurrent severe without psychotic features: Secondary | ICD-10-CM | POA: Diagnosis not present

## 2020-08-20 DIAGNOSIS — F4312 Post-traumatic stress disorder, chronic: Secondary | ICD-10-CM | POA: Diagnosis not present

## 2020-08-20 DIAGNOSIS — F332 Major depressive disorder, recurrent severe without psychotic features: Secondary | ICD-10-CM | POA: Diagnosis not present

## 2020-08-21 DIAGNOSIS — F332 Major depressive disorder, recurrent severe without psychotic features: Secondary | ICD-10-CM | POA: Diagnosis not present

## 2020-08-21 DIAGNOSIS — F4312 Post-traumatic stress disorder, chronic: Secondary | ICD-10-CM | POA: Diagnosis not present

## 2020-08-22 DIAGNOSIS — R202 Paresthesia of skin: Secondary | ICD-10-CM | POA: Diagnosis not present

## 2020-08-22 DIAGNOSIS — F332 Major depressive disorder, recurrent severe without psychotic features: Secondary | ICD-10-CM | POA: Diagnosis not present

## 2020-08-23 ENCOUNTER — Encounter: Payer: Self-pay | Admitting: Neurology

## 2020-08-23 DIAGNOSIS — F332 Major depressive disorder, recurrent severe without psychotic features: Secondary | ICD-10-CM | POA: Diagnosis not present

## 2020-08-23 DIAGNOSIS — F4312 Post-traumatic stress disorder, chronic: Secondary | ICD-10-CM | POA: Diagnosis not present

## 2020-08-26 ENCOUNTER — Other Ambulatory Visit: Payer: Self-pay

## 2020-08-27 NOTE — Progress Notes (Deleted)
Name: Stephanie Fisher  MRN/ DOB: [id number], [birth year]    Age/ Sex: 55 y.o., female     PCP: Dois Davenport, MD   Reason for Endocrinology Evaluation: Hyperthyroidism      Initial Endocrinology Clinic Visit: 05/10/2020    PATIENT IDENTIFIER: [name] is a 74 y.o., female with unremarkable  past medical history . She has followed with [facility] since 05/10/2020 for consultative assistance with management of her hyperthyroidism  HISTORICAL SUMMARY: The patient was first diagnosed with Hyperthyroidism secondary to Graves' disease in 03/2020. A thyroid uptake and scan ( 03/2020 ) demonstrated a 24 hour I-123 uptake = 79.6%. Thyroid ultrasound did not reveal any nodules.   She had a loss summer 2020 and has cognitive issues as well as memory loss.   She has been on methimazole since 03/2020  She is adopted  SUBJECTIVE:    Today (08/28/2020):  [name] is here for a follow up on hyperthyroidism secondary to Graves' Disease  Weight back to baseline.   She continues with anxiety  Denies diarrhea  Has palpitations    Wakes up several times through the night with body tension.   Was started on Prozac but that exacerbated her night symptoms and eventually stopped it.    Methimazole 5 mg, 1 tablet daily   HISTORY:  Past Medical History:  Past Medical History:  Diagnosis Date  . ADHD (attention deficit hyperactivity disorder)   . Anxiety   . Depression     Past Surgical History: No past surgical history on file.  Social History:  reports that she has never smoked. She has never used smokeless tobacco. She reports that she does not drink alcohol and does not use drugs. Family History:  Family History  Adopted: Yes     HOME MEDICATIONS: Allergies as of 08/28/2020   No Known Allergies     Medication List       Accurate as of August 28, 2020  7:09 AM. If you have any questions, ask your nurse or doctor.        methimazole 5 MG  tablet Commonly known as: TAPAZOLE Take 1 tablet (5 mg total) by mouth daily.         OBJECTIVE:   PHYSICAL EXAM: VS: LMP  (LMP Unknown) Comment: over 1 year   EXAM: General: Pt appears well and is in NAD  Neck: General: Supple without adenopathy. Thyroid: Thyroid size normal.  No goiter or nodules appreciated.   Lungs: Clear with good BS bilat with no rales, rhonchi, or wheezes  Heart: Auscultation: RRR.  Abdomen: Normoactive bowel sounds, soft, nontender, without masses or organomegaly palpable  Extremities:  BL LE: No pretibial edema normal ROM and strength.  Skin: Hair: Texture and amount normal with gender appropriate distribution Skin Inspection: No rashes Skin Palpation: Skin temperature, texture, and thickness normal to palpation  Neuro: Cranial nerves: II - XII grossly intact  Motor: Normal strength throughout DTRs: 2+ and symmetric in UE without delay in relaxation phase  Mental Status: Judgment, insight: Intact Orientation: Oriented to time, place, and person Mood and affect: No depression, anxiety, or agitation     DATA REVIEWED:  Results for PAISLIE, TESSLER (MRN [id number]) as of 06/18/2020 07:33  Ref. Range 06/17/2020 14:53  TSH Latest Ref Range: 0.35 - 4.50 uIU/mL 12.65 (H)  T4,Free(Direct) Latest Ref Range: 0.60 - 1.60 ng/dL 2.35 (L)     Thyroid Uptake and Scan 04/18/2020  Homogeneous tracer distribution in both  thyroid lobes.  No focal areas of increased or decreased tracer localization.  4 hour I-123 uptake = 45.2% (normal 5-20%)  24 hour I-123 uptake = 79.6% (normal 10-30%)  IMPRESSION: Normal thyroid scan.  Markedly elevated 4 hour and 24 hour radio iodine uptakes.  Findings consistent with Graves disease.  Thyroid ultrasound 03/27/2020  No discrete nodules are seen within the thyroid gland. The thyroid parenchyma is very hypervascular bilaterally. No abnormal lymph nodes identified.  IMPRESSION: Thyroid parenchyma is  heterogeneous and very hypervascular without focal nodules. Overall thyroid gland volume is within normal limits. Increased vascularity can be suggestive of thyroiditis.  ASSESSMENT / PLAN / RECOMMENDATIONS:   1. Hyperthyroidism secondary to Graves' Disease:   - Pt is clinically euthyroid except for anxiety and insomnia  - She is tolerating methimazole without side effects - Repeat TFT's today show hypothyroidism, will reduce the dose below    Medications  Methimazole 5 mg , to one tablet daily      2. Graves' Disease:  - No extra thyroidal manifestations of Graves' disease    F/U in 3 months  Labs in 6 weeks    Signed electronically by: Lyndle Herrlich, MD  [facility] Endocrinology  [facility] [address] [place], [place] [address] Phone: [phone] FAX: [phone]      CC: Dois Davenport, MD [address] [place] [address] [place] [address] Phone: [phone]  Fax: [phone]   Return to Endocrinology clinic as below: Future Appointments  Date Time Provider Department Center  08/28/2020  7:30 AM Fintan Grater, Konrad Dolores, MD [facility] None  11/08/2020 12:50 PM Nita Sickle K, DO [facility] None

## 2020-08-28 ENCOUNTER — Ambulatory Visit: Payer: BC Managed Care – PPO | Admitting: Internal Medicine

## 2020-08-28 ENCOUNTER — Telehealth: Payer: Self-pay | Admitting: Internal Medicine

## 2020-08-28 ENCOUNTER — Encounter: Payer: Self-pay | Admitting: Internal Medicine

## 2020-08-28 DIAGNOSIS — F4312 Post-traumatic stress disorder, chronic: Secondary | ICD-10-CM | POA: Diagnosis not present

## 2020-08-28 DIAGNOSIS — F332 Major depressive disorder, recurrent severe without psychotic features: Secondary | ICD-10-CM | POA: Diagnosis not present

## 2020-08-28 NOTE — Telephone Encounter (Signed)
Spoken to patient and she stated that she have already seen by NP at her pcp office. Symptoms not better. She stated that she have an at home test for Covid which she plan on testing later today.

## 2020-08-28 NOTE — Telephone Encounter (Signed)
Pt called because she spoke to a night nurse last night and they suggested she get in contact with [place] based on pt's current symptoms:  -throat feels enflamed all the way to esophagus   -pain in back of neck and cervical neck  -tingling in upper arms and back/ sometimes burning  -night sweats  -burning scalp   -cognitive issues  -burning in chest   Ph#[phone]

## 2020-08-28 NOTE — Telephone Encounter (Signed)
Her thyroid tests were normal  ~ 3 weeks ago, so I can't say this is related to her thyroid. Pt needs to be tested for COVID or FLu if her PCP has not done so.  The pt missed multiple appointments with me and a warning letter has been sent

## 2020-08-28 NOTE — Telephone Encounter (Signed)
Spoken to patient and notified Dr Shamleffer's comments. Verbalized understanding.   

## 2020-08-29 DIAGNOSIS — F332 Major depressive disorder, recurrent severe without psychotic features: Secondary | ICD-10-CM | POA: Diagnosis not present

## 2020-08-30 DIAGNOSIS — F4312 Post-traumatic stress disorder, chronic: Secondary | ICD-10-CM | POA: Diagnosis not present

## 2020-08-30 DIAGNOSIS — F332 Major depressive disorder, recurrent severe without psychotic features: Secondary | ICD-10-CM | POA: Diagnosis not present

## 2020-09-02 DIAGNOSIS — F4312 Post-traumatic stress disorder, chronic: Secondary | ICD-10-CM | POA: Diagnosis not present

## 2020-09-03 DIAGNOSIS — F332 Major depressive disorder, recurrent severe without psychotic features: Secondary | ICD-10-CM | POA: Diagnosis not present

## 2020-09-04 DIAGNOSIS — R5383 Other fatigue: Secondary | ICD-10-CM | POA: Diagnosis not present

## 2020-09-04 DIAGNOSIS — F411 Generalized anxiety disorder: Secondary | ICD-10-CM | POA: Diagnosis not present

## 2020-09-04 DIAGNOSIS — F332 Major depressive disorder, recurrent severe without psychotic features: Secondary | ICD-10-CM | POA: Diagnosis not present

## 2020-09-04 DIAGNOSIS — E0590 Thyrotoxicosis, unspecified without thyrotoxic crisis or storm: Secondary | ICD-10-CM | POA: Diagnosis not present

## 2020-09-04 DIAGNOSIS — R200 Anesthesia of skin: Secondary | ICD-10-CM | POA: Diagnosis not present

## 2020-09-05 DIAGNOSIS — F4312 Post-traumatic stress disorder, chronic: Secondary | ICD-10-CM | POA: Diagnosis not present

## 2020-09-05 DIAGNOSIS — F332 Major depressive disorder, recurrent severe without psychotic features: Secondary | ICD-10-CM | POA: Diagnosis not present

## 2020-09-06 DIAGNOSIS — F332 Major depressive disorder, recurrent severe without psychotic features: Secondary | ICD-10-CM | POA: Diagnosis not present

## 2020-09-06 DIAGNOSIS — F4312 Post-traumatic stress disorder, chronic: Secondary | ICD-10-CM | POA: Diagnosis not present

## 2020-09-09 DIAGNOSIS — F332 Major depressive disorder, recurrent severe without psychotic features: Secondary | ICD-10-CM | POA: Diagnosis not present

## 2020-09-10 DIAGNOSIS — F4312 Post-traumatic stress disorder, chronic: Secondary | ICD-10-CM | POA: Diagnosis not present

## 2020-09-10 DIAGNOSIS — F332 Major depressive disorder, recurrent severe without psychotic features: Secondary | ICD-10-CM | POA: Diagnosis not present

## 2020-09-11 DIAGNOSIS — F4312 Post-traumatic stress disorder, chronic: Secondary | ICD-10-CM | POA: Diagnosis not present

## 2020-09-11 DIAGNOSIS — F332 Major depressive disorder, recurrent severe without psychotic features: Secondary | ICD-10-CM | POA: Diagnosis not present

## 2020-09-12 DIAGNOSIS — F332 Major depressive disorder, recurrent severe without psychotic features: Secondary | ICD-10-CM | POA: Diagnosis not present

## 2020-09-12 DIAGNOSIS — F4312 Post-traumatic stress disorder, chronic: Secondary | ICD-10-CM | POA: Diagnosis not present

## 2020-09-13 DIAGNOSIS — F4312 Post-traumatic stress disorder, chronic: Secondary | ICD-10-CM | POA: Diagnosis not present

## 2020-09-13 DIAGNOSIS — F332 Major depressive disorder, recurrent severe without psychotic features: Secondary | ICD-10-CM | POA: Diagnosis not present

## 2020-09-16 ENCOUNTER — Ambulatory Visit: Payer: BC Managed Care – PPO | Admitting: Internal Medicine

## 2020-09-16 DIAGNOSIS — F4312 Post-traumatic stress disorder, chronic: Secondary | ICD-10-CM | POA: Diagnosis not present

## 2020-09-16 DIAGNOSIS — F332 Major depressive disorder, recurrent severe without psychotic features: Secondary | ICD-10-CM | POA: Diagnosis not present

## 2020-09-17 DIAGNOSIS — F332 Major depressive disorder, recurrent severe without psychotic features: Secondary | ICD-10-CM | POA: Diagnosis not present

## 2020-09-17 DIAGNOSIS — F4312 Post-traumatic stress disorder, chronic: Secondary | ICD-10-CM | POA: Diagnosis not present

## 2020-09-18 DIAGNOSIS — F4312 Post-traumatic stress disorder, chronic: Secondary | ICD-10-CM | POA: Diagnosis not present

## 2020-09-18 DIAGNOSIS — F332 Major depressive disorder, recurrent severe without psychotic features: Secondary | ICD-10-CM | POA: Diagnosis not present

## 2020-09-19 DIAGNOSIS — F332 Major depressive disorder, recurrent severe without psychotic features: Secondary | ICD-10-CM | POA: Diagnosis not present

## 2020-09-19 DIAGNOSIS — F4312 Post-traumatic stress disorder, chronic: Secondary | ICD-10-CM | POA: Diagnosis not present

## 2020-09-20 DIAGNOSIS — F332 Major depressive disorder, recurrent severe without psychotic features: Secondary | ICD-10-CM | POA: Diagnosis not present

## 2020-09-23 DIAGNOSIS — F332 Major depressive disorder, recurrent severe without psychotic features: Secondary | ICD-10-CM | POA: Diagnosis not present

## 2020-09-24 DIAGNOSIS — F332 Major depressive disorder, recurrent severe without psychotic features: Secondary | ICD-10-CM | POA: Diagnosis not present

## 2020-09-25 DIAGNOSIS — F332 Major depressive disorder, recurrent severe without psychotic features: Secondary | ICD-10-CM | POA: Diagnosis not present

## 2020-09-26 DIAGNOSIS — F332 Major depressive disorder, recurrent severe without psychotic features: Secondary | ICD-10-CM | POA: Diagnosis not present

## 2020-09-27 DIAGNOSIS — F332 Major depressive disorder, recurrent severe without psychotic features: Secondary | ICD-10-CM | POA: Diagnosis not present

## 2020-10-01 DIAGNOSIS — F332 Major depressive disorder, recurrent severe without psychotic features: Secondary | ICD-10-CM | POA: Diagnosis not present

## 2020-10-02 DIAGNOSIS — F332 Major depressive disorder, recurrent severe without psychotic features: Secondary | ICD-10-CM | POA: Diagnosis not present

## 2020-10-04 DIAGNOSIS — F332 Major depressive disorder, recurrent severe without psychotic features: Secondary | ICD-10-CM | POA: Diagnosis not present

## 2020-10-07 ENCOUNTER — Ambulatory Visit: Payer: BC Managed Care – PPO | Admitting: Neurology

## 2020-10-08 DIAGNOSIS — F332 Major depressive disorder, recurrent severe without psychotic features: Secondary | ICD-10-CM | POA: Diagnosis not present

## 2020-10-09 DIAGNOSIS — F332 Major depressive disorder, recurrent severe without psychotic features: Secondary | ICD-10-CM | POA: Diagnosis not present

## 2020-10-10 ENCOUNTER — Other Ambulatory Visit: Payer: Self-pay

## 2020-10-11 ENCOUNTER — Encounter: Payer: Self-pay | Admitting: Neurology

## 2020-10-11 ENCOUNTER — Other Ambulatory Visit: Payer: Self-pay

## 2020-10-11 ENCOUNTER — Ambulatory Visit: Payer: BC Managed Care – PPO | Admitting: Neurology

## 2020-10-11 VITALS — BP 109/65 | HR 62 | Resp 18 | Ht 65.0 in | Wt 116.0 lb

## 2020-10-11 DIAGNOSIS — R202 Paresthesia of skin: Secondary | ICD-10-CM | POA: Diagnosis not present

## 2020-10-11 DIAGNOSIS — R413 Other amnesia: Secondary | ICD-10-CM | POA: Diagnosis not present

## 2020-10-11 NOTE — Progress Notes (Signed)
[facility] Neurology Division Clinic Note - Initial Visit   Date: 10/11/2020:   Stephanie Fisher MRN: [id number] DOB: 01-01-1966   Dear Dr. Hal Hope:  Thank you for your kind referral of [name] for consultation of parethesias. Although her history is well known to you, please allow [place] to reiterate it for the purpose of our medical record. The patient was accompanied to the clinic by self.    History of Present Illness: Stephanie Fisher is a 55 y.o. right-handed female with ADHD, anxiety/depression presenting for evaluation of migratory paresthesias.   For the past year, patient has been not feeling well. She has tension and burning, prickly sensation down her spine and back.  She also has numbness/tingling over the scalp and into her legs.  She has soreness of the throat and sometimes get tight sensation.  She is requesting MRI cervical spine to look at her symptoms.  She was previously working as a Teacher, adult education, but no longer working because of tremendous fatigue.  She is very tearful during the visit.  Out-side paper records, electronic medical record, and images have been reviewed where available and summarized as:  No results found for: HGBA1C No results found for: VITAMINB12 Lab Results  Component Value Date   TSH 2.10 07/30/2020   No results found for: ESRSEDRATE, POCTSEDRATE  Past Medical History:  Diagnosis Date  . ADHD (attention deficit hyperactivity disorder)   . Anxiety   . Depression     No past surgical history on file.   Medications:  Outpatient Encounter Medications as of 10/11/2020  Medication Sig  . buPROPion (WELLBUTRIN XL) 150 MG 24 hr tablet Take 150 mg by mouth every morning.  . methimazole (TAPAZOLE) 5 MG tablet Take 1 tablet (5 mg total) by mouth daily.  . hydrOXYzine (ATARAX/VISTARIL) 10 MG tablet Take by mouth. (Patient not taking: Reported on 10/14/2020)   No facility-administered encounter medications on file as of 10/11/2020.     Allergies: No Known Allergies  Family History: Family History  Adopted: Yes    Social History: Social History   Tobacco Use  . Smoking status: Never Smoker  . Smokeless tobacco: Never Used  Substance Use Topics  . Alcohol use: No  . Drug use: No   Social History   Social History Narrative   Right handed   No caffeine   One story apartment    Vital Signs:  BP 109/65   Pulse 62   Resp 18   Ht 5\' 5"  (1.651 m)   Wt 116 lb (52.6 kg)   LMP  (LMP Unknown) Comment: over 1 year  SpO2 99%   BMI 19.30 kg/m   Neurological Exam: MENTAL STATUS including orientation to time, place, person, recent and remote memory, attention span and concentration, language, and fund of knowledge is normal.  Speech is not dysarthric.  Tearful during the exam.  Anxious appearing.  CRANIAL NERVES: II:  No visual field defects.   III-IV-VI: Pupils equal round and reactive to light.  Normal conjugate, extra-ocular eye movements in all directions of gaze.  No nystagmus.  No ptosis.   V:  Normal facial sensation.    VII:  Normal facial symmetry and movements.   VIII:  Normal hearing and vestibular function.   IX-X:  Normal palatal movement.   XI:  Normal shoulder shrug and head rotation.   XII:  Normal tongue strength and range of motion, no deviation or fasciculation.  MOTOR:  No atrophy, fasciculations or abnormal movements.  No pronator drift.   Upper Extremity:  Right  Left  Deltoid  5/5   5/5   Biceps  5/5   5/5   Triceps  5/5   5/5   Infraspinatus 5/5  5/5  Medial pectoralis 5/5  5/5  Wrist extensors  5/5   5/5   Wrist flexors  5/5   5/5   Finger extensors  5/5   5/5   Finger flexors  5/5   5/5   Dorsal interossei  5/5   5/5   Abductor pollicis  5/5   5/5   Tone (Ashworth scale)  0  0   Lower Extremity:  Right  Left  Hip flexors  5/5   5/5   Hip extensors  5/5   5/5   Adductor 5/5  5/5  Abductor 5/5  5/5  Knee flexors  5/5   5/5   Knee extensors  5/5   5/5   Dorsiflexors   5/5   5/5   Plantarflexors  5/5   5/5   Toe extensors  5/5   5/5   Toe flexors  5/5   5/5   Tone (Ashworth scale)  0  0   MSRs:  Right        Left                  brachioradialis 2+  2+  biceps 2+  2+  triceps 2+  2+  patellar 2+  2+  ankle jerk 2+  2+  Hoffman no  no  plantar response down  down   SENSORY:  Normal and symmetric perception of light touch, pinprick, vibration, and proprioception.  Romberg's sign absent.   COORDINATION/GAIT: Normal finger-to- nose-finger and heel-to-shin.  Intact rapid alternating movements bilaterally. Gait narrow based and stable. Tandem and stressed gait intact.    IMPRESSION: Migratory paresthesias in the setting of normal neurological exam.  I reassured patient that the likelihood of anything worrisome is low given that her paresthesias, do not follow a nerve distribution.  To be complete and be sure we are not missing demyelinating disease, I recommend checking MRI brain wo contrast.  Patient is also requesting MRI cervical spine, although I explained that anatomically, a cervical cord lesion would not explain her scalp and facial symptoms.  She was frustrated with the fact that I did not have a treatment plan or diagnosis for her. I tried to explain that stress reaction and anxiety are common to present with similar symptoms as she has. She is already seeing a Veterinary surgeon.    Thank you for allowing me to participate in patient's care.  If I can answer any additional questions, I would be pleased to do so.    Sincerely,    Cele Mote K. Allena Katz, DO

## 2020-10-11 NOTE — Patient Instructions (Addendum)
We will order MRI brain and cervical spine    We have sent a referral to [facility] for your MRI's and they will call you directly to schedule your appointment. They are located at [address] [facility]. If you need to contact them directly please call [phone].

## 2020-10-14 ENCOUNTER — Other Ambulatory Visit: Payer: Self-pay

## 2020-10-14 ENCOUNTER — Ambulatory Visit: Payer: BC Managed Care – PPO | Admitting: Internal Medicine

## 2020-10-14 ENCOUNTER — Encounter: Payer: Self-pay | Admitting: Internal Medicine

## 2020-10-14 VITALS — BP 110/68 | HR 82 | Ht 65.5 in | Wt 118.5 lb

## 2020-10-14 DIAGNOSIS — E0590 Thyrotoxicosis, unspecified without thyrotoxic crisis or storm: Secondary | ICD-10-CM

## 2020-10-14 DIAGNOSIS — E0500 Thyrotoxicosis with diffuse goiter without thyrotoxic crisis or storm: Secondary | ICD-10-CM | POA: Diagnosis not present

## 2020-10-14 DIAGNOSIS — F332 Major depressive disorder, recurrent severe without psychotic features: Secondary | ICD-10-CM | POA: Diagnosis not present

## 2020-10-14 NOTE — Progress Notes (Signed)
Name: Stephanie Fisher  MRN/ DOB: [id number], September 23, 1965    Age/ Sex: 55 y.o., female     PCP: Dois Davenport, MD   Reason for Endocrinology Evaluation: Hyperthyroidism      Initial Endocrinology Clinic Visit: 05/10/2020    PATIENT IDENTIFIER: Stephanie Fisher is a 55 y.o., female with unremarkable  past medical history . She has followed with [facility] since 05/10/2020 for consultative assistance with management of her hyperthyroidism      HISTORICAL SUMMARY: The patient was first diagnosed with Hyperthyroidism secondary to Graves' disease in 03/2020. A thyroid uptake and scan ( 03/2020 ) demonstrated a 24 hour I-123 uptake = 79.6%. Thyroid ultrasound did not reveal any nodules.   She had a loss summer 2020 and has cognitive issues as well as memory loss.   She has been on methimazole since 03/2020  She is adopted    Sees psychiatry , prozac exacerbated night symptoms.  SUBJECTIVE:    Today (10/14/2020):  Stephanie Fisher is here for a follow up on hyperthyroidism secondary to Graves' Disease  Weight has been stable  She continues with anxiety  Denies diarrhea but has constipation Has occasional  palpitations  Has throat constriction, and raspy  Continues with hair loss   Has fatigue   Methimazole 5 mg, daily ( has 10 mg and taking half )  HISTORY:  Past Medical History:  Past Medical History:  Diagnosis Date  . ADHD (attention deficit hyperactivity disorder)   . Anxiety   . Depression     Past Surgical History: No past surgical history on file.  Social History:  reports that she has never smoked. She has never used smokeless tobacco. She reports that she does not drink alcohol and does not use drugs. Family History:  Family History  Adopted: Yes     HOME MEDICATIONS: Allergies as of 10/14/2020   No Known Allergies     Medication List       Accurate as of October 14, 2020  7:18 AM. If you have any questions, ask your nurse or doctor.         buPROPion 150 MG 24 hr tablet Commonly known as: WELLBUTRIN XL Take 150 mg by mouth every morning.   hydrOXYzine 10 MG tablet Commonly known as: ATARAX/VISTARIL Take by mouth.   methimazole 5 MG tablet Commonly known as: TAPAZOLE Take 1 tablet (5 mg total) by mouth daily.         OBJECTIVE:   PHYSICAL EXAM: VS: BP 110/68   Pulse 82   Ht 5' 5.5" (1.664 m)   Wt 118 lb 8 oz (53.8 kg)   LMP  (LMP Unknown) Comment: over 1 year  SpO2 97%   BMI 19.42 kg/m    EXAM: General: Pt appears well and is in NAD  Neck: General: Supple without adenopathy. Thyroid: Thyroid size normal.  No goiter or nodules appreciated.   Lungs: Clear with good BS bilat with no rales, rhonchi, or wheezes  Heart: Auscultation: RRR.  Abdomen: Normoactive bowel sounds, soft, nontender, without masses or organomegaly palpable  Extremities:  BL LE: No pretibial edema normal ROM and strength.  Neuro: Cranial nerves: II - XII grossly intact  Motor: Normal strength throughout DTRs: 2+ and symmetric in UE without delay in relaxation phase  Mental Status: Judgment, insight: Intact Orientation: Oriented to time, place, and person Mood and affect: No depression, anxiety, or agitation     DATA REVIEWED:  Results for Stephanie Fisher, Stephanie Fisher (  MRN [id number]) as of 06/18/2020 07:33  Ref. Range 06/17/2020 14:53  TSH Latest Ref Range: 0.35 - 4.50 uIU/mL 12.65 (H)  T4,Free(Direct) Latest Ref Range: 0.60 - 1.60 ng/dL 1.28 (L)     Thyroid Uptake and Scan 04/18/2020  Homogeneous tracer distribution in both thyroid lobes.  No focal areas of increased or decreased tracer localization.  4 hour I-123 uptake = 45.2% (normal 5-20%)  24 hour I-123 uptake = 79.6% (normal 10-30%)  IMPRESSION: Normal thyroid scan.  Markedly elevated 4 hour and 24 hour radio iodine uptakes.  Findings consistent with Graves disease.  Thyroid ultrasound 03/27/2020  No discrete nodules are seen within the thyroid gland. The  thyroid parenchyma is very hypervascular bilaterally. No abnormal lymph nodes identified.  IMPRESSION: Thyroid parenchyma is heterogeneous and very hypervascular without focal nodules. Overall thyroid gland volume is within normal limits. Increased vascularity can be suggestive of thyroiditis.  ASSESSMENT / PLAN / RECOMMENDATIONS:   1. Hyperthyroidism secondary to Graves' Disease:   - Pt is clinically euthyroid except for anxiety and insomnia  - She is tolerating methimazole without side effects - She tells me she just had labs recently at PCP 's office, she will provide those results.   Medications  Decrease Methimazole 5 mg , one tablet daily      2. Graves' Disease:  - No extra thyroidal manifestations of Graves' disease    F/U in 4 months  Labs in 8 weeks    Signed electronically by: Lyndle Herrlich, MD  [facility]  [facility] [address] [place] [place], [place] [address] Phone: [phone] FAX: [phone]      CC: Dois Davenport, MD [address] [place] [address] Phone: [phone]  Fax: [phone]   Return to Endocrinology clinic as below: Future Appointments  Date Time Provider Department Center  10/14/2020 10:50 AM Azalynn Maxim, Konrad Dolores, MD [facility] None

## 2020-10-14 NOTE — Patient Instructions (Signed)
-   Continue Methimazole 5 mg, daily

## 2020-10-16 DIAGNOSIS — F332 Major depressive disorder, recurrent severe without psychotic features: Secondary | ICD-10-CM | POA: Diagnosis not present

## 2020-10-29 DIAGNOSIS — F332 Major depressive disorder, recurrent severe without psychotic features: Secondary | ICD-10-CM | POA: Diagnosis not present

## 2020-11-02 ENCOUNTER — Ambulatory Visit
Admission: RE | Admit: 2020-11-02 | Discharge: 2020-11-02 | Disposition: A | Discharge status: Home / Self Care | Payer: BC Managed Care – PPO | Source: Ambulatory Visit | Attending: Neurology | Admitting: Neurology

## 2020-11-02 ENCOUNTER — Other Ambulatory Visit: Payer: Self-pay

## 2020-11-02 DIAGNOSIS — H748X3 Other specified disorders of middle ear and mastoid, bilateral: Secondary | ICD-10-CM | POA: Diagnosis not present

## 2020-11-02 DIAGNOSIS — R200 Anesthesia of skin: Secondary | ICD-10-CM | POA: Diagnosis not present

## 2020-11-02 DIAGNOSIS — M4802 Spinal stenosis, cervical region: Secondary | ICD-10-CM | POA: Diagnosis not present

## 2020-11-02 DIAGNOSIS — R202 Paresthesia of skin: Secondary | ICD-10-CM | POA: Diagnosis not present

## 2020-11-04 ENCOUNTER — Telehealth: Payer: Self-pay

## 2020-11-04 NOTE — Telephone Encounter (Signed)
Called patient and left a message for a call back.  

## 2020-11-04 NOTE — Telephone Encounter (Signed)
-----   Message from Glendale Chard, DO sent at 11/04/2020 12:59 PM EDT ----- Please inform pt that her MRI brain is normal.  MRI cervical spine shows some age-related changes and narrowing, but this would not explain all of her symptoms.  Specifically, there is no multiple sclerosis, tumor, or bleed.  Recommend follow-up with her PCP.  As I mentioned in the office, symptoms are most likely manifestation of stress.

## 2020-11-04 NOTE — Telephone Encounter (Signed)
Pt advised of her MRI  Results.

## 2020-11-08 ENCOUNTER — Ambulatory Visit: Payer: BC Managed Care – PPO | Admitting: Neurology

## 2020-11-26 ENCOUNTER — Telehealth: Payer: Self-pay | Admitting: Neurology

## 2020-11-26 ENCOUNTER — Other Ambulatory Visit: Payer: Self-pay | Admitting: *Deleted

## 2020-11-26 NOTE — Telephone Encounter (Signed)
Patient requests a referral for a sleep study.

## 2020-11-26 NOTE — Telephone Encounter (Signed)
Please advise 

## 2020-11-26 NOTE — Telephone Encounter (Signed)
Recommend patient request this through her PCP's office, since we do not manage sleep conditions.

## 2020-11-27 NOTE — Telephone Encounter (Signed)
LVM--to call the office back. 

## 2020-11-28 NOTE — Telephone Encounter (Signed)
Spoke to pt and agreed to call PCP for a referral for a sleep study.

## 2020-12-09 DIAGNOSIS — F411 Generalized anxiety disorder: Secondary | ICD-10-CM | POA: Diagnosis not present

## 2020-12-09 DIAGNOSIS — D72819 Decreased white blood cell count, unspecified: Secondary | ICD-10-CM | POA: Diagnosis not present

## 2020-12-09 DIAGNOSIS — F39 Unspecified mood [affective] disorder: Secondary | ICD-10-CM | POA: Diagnosis not present

## 2020-12-09 DIAGNOSIS — E0590 Thyrotoxicosis, unspecified without thyrotoxic crisis or storm: Secondary | ICD-10-CM | POA: Diagnosis not present

## 2020-12-09 DIAGNOSIS — F410 Panic disorder [episodic paroxysmal anxiety] without agoraphobia: Secondary | ICD-10-CM | POA: Diagnosis not present

## 2020-12-09 DIAGNOSIS — R4189 Other symptoms and signs involving cognitive functions and awareness: Secondary | ICD-10-CM | POA: Diagnosis not present

## 2020-12-21 ENCOUNTER — Other Ambulatory Visit: Payer: Self-pay

## 2020-12-21 ENCOUNTER — Ambulatory Visit (HOSPITAL_COMMUNITY)
Admission: EM | Admit: 2020-12-21 | Discharge: 2020-12-21 | Disposition: A | Discharge status: Home / Self Care | Payer: BC Managed Care – PPO

## 2020-12-21 DIAGNOSIS — F419 Anxiety disorder, unspecified: Secondary | ICD-10-CM

## 2020-12-21 NOTE — ED Notes (Signed)
Pt discharged with resources in hand. Verbalized understanding of recommendations provided by staff. Safety maintained.

## 2020-12-21 NOTE — ED Provider Notes (Signed)
Behavioral Health Urgent Care Medical Screening Exam  Patient Name: Stephanie Fisher MRN: [id number] Date of Evaluation: 12/21/20 Chief Complaint:   Diagnosis:  Final diagnoses:  Anxiety    History of Present illness: Stephanie Fisher is a 55 y.o. female.  Patient presents voluntarily to [facility] behavioral health for walk-in assessment.  She reports seeking care today because "I had a panic attack in an AA meeting today, I feel like I cannot continue to live like this."  Patient assessed by nurse practitioner.  She requests that her friend, Misty Stanley, remain present during assessment.  She denies suicidal and homicidal ideations.  She denies any history of suicide attempts, denies self-harm behaviors.  She denies auditory and visual hallucinations.  There is no evidence of delusional thought content and she denies symptoms of paranoia.  She contracts verbally for safety with this Clinical research associate.  Toshiko Kemler reports recent stressors include the death of her son 2 years ago.  She reports her 34 year old son has recently experienced panic attacks..  She states "I am not who I want to be, I am not the mother I want to be."  She reports physical symptoms she believes may be related to her mood including "brain fog, tension in occipital area, numbness down back, memory loss."    She reports feeling that her outpatient primary care provider is "fed up with me."  She is awaiting a sleep study as she "wakes up feeling exhausted."  Patient has been diagnosed with anxiety and depression per her report.  She reports she has seen two psychiatrists and been prescribed several medications over the last 2 years.  She reports she was prescribed Wellbutrin but felt that this "made the panic worse."  She was also prescribed Prozac but this medication caused her to feel sleepy.  She reports she has been prescribed both Celexa and Lexapro in the past as well.  She is scheduled for an outpatient psychiatrist appointment at the  end of this month.  She is also followed by outpatient counseling, Magdalen Spatz, follows weekly.  She resides in [place] with her 22 and 45 year old sons.  She denies access to weapons.  She is currently not employed.  She endorses average sleep and appetite.  Reports she sleeps "between 6 and 9 hours per night but wake up feeling exhausted."  She denies alcohol and substance use.  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  Patient offered support and encouragement.  She gives consent to speak with friend, Misty Stanley.  Patient's friend denies concern for patient safety.  Psychiatric Specialty Exam  Presentation  General Appearance:Appropriate for Environment; Casual  Eye Contact:Good  Speech:Clear and Coherent; Normal Rate  Speech Volume:Normal  Handedness:Right   Mood and Affect  Mood:Anxious  Affect:Appropriate; Congruent   Thought Process  Thought Processes:Coherent; Goal Directed  Descriptions of Associations:Intact  Orientation:Full (Time, Place and Person)  Thought Content:Logical; WDL    Hallucinations:None  Ideas of Reference:None  Suicidal Thoughts:No  Homicidal Thoughts:No   Sensorium  Memory:Immediate Good; Recent Good; Remote Good  Judgment:Good  Insight:Fair   Executive Functions  Concentration:Good  Attention Span:Good  Recall:Good  Fund of Knowledge:Good  Language:Good   Psychomotor Activity  Psychomotor Activity:Normal   Assets  Assets:Communication Skills; Desire for Improvement; Financial Resources/Insurance; Intimacy; Housing; Leisure Time; Physical Health; Resilience; Social Support; Talents/Skills; Transportation   Sleep  Sleep:Good  Number of hours: No data recorded  No data recorded  Physical  Exam: Physical Exam Vitals and nursing note reviewed.  Constitutional:      Appearance: Normal appearance. She is well-developed and normal weight.  HENT:     Head:  Normocephalic and atraumatic.     Nose: Nose normal.  Cardiovascular:     Rate and Rhythm: Normal rate.  Pulmonary:     Effort: Pulmonary effort is normal.  Musculoskeletal:     Cervical back: Normal range of motion.  Neurological:     Mental Status: She is alert and oriented to person, place, and time.  Psychiatric:        Attention and Perception: Attention and perception normal.        Mood and Affect: Affect normal. Mood is anxious.        Speech: Speech normal.        Behavior: Behavior normal. Behavior is cooperative.        Thought Content: Thought content normal.        Cognition and Memory: Cognition and memory normal.        Judgment: Judgment normal.    Review of Systems  Constitutional: Negative.   HENT: Negative.   Eyes: Negative.   Respiratory: Negative.   Cardiovascular: Negative.   Gastrointestinal: Negative.   Genitourinary: Negative.   Musculoskeletal: Negative.   Skin: Negative.   Neurological: Negative.   Endo/Heme/Allergies: Negative.   Psychiatric/Behavioral: The patient is nervous/anxious.    Blood pressure 106/66, pulse 74, temperature 97.8 F (36.6 C), temperature source Oral, resp. rate 16, SpO2 100 %. There is no height or weight on file to calculate BMI.  Musculoskeletal: Strength & Muscle Tone: within normal limits Gait & Station: normal Patient leans: N/A   [facility] MSE Discharge Disposition for Follow up and Recommendations: Based on my evaluation the patient does not appear to have an emergency medical condition and can be discharged with resources and follow up care in outpatient services for Medication Management and Individual Therapy  Patient reviewed with Dr. Bronwen Betters. Follow-up with outpatient psychiatry, resources provided.    Lenard Lance, FNP 12/21/2020, 3:27 PM

## 2020-12-21 NOTE — BH Assessment (Signed)
Triage Note:   Disposition: Berneice Heinrich, NP, patient psych-cleared with recommendation to follow up with psychiatrist and therapist   Stephanie Fisher is a 55 y.o. female.  Patient presents voluntarily to [facility] behavioral health for walk-in assessment. She reports seeking care today because "I had a panic attack in an AA meeting today, I feel like I cannot continue to live like this." Stephanie Fisher reports recent stressors include the death of her son 2 years ago.  She reports her 5 year old son has recently experienced panic attacks..  She states "I am not who I want to be, I am not the mother I want to be."  She reports physical symptoms she believes may be related to her mood including "brain fog, tension in occipital area, numbness down back, memory loss."    She reports feeling that her outpatient primary care provider is "fed up with me."  She is awaiting a sleep study as she "wakes up feeling exhausted." She resides in [place] with her 42 and 86 year old sons.  She denies access to weapons.  She is currently not employed.  She endorses average sleep and appetite.  Reports she sleeps "between 6 and 9 hours per night but wake up feeling exhausted." She denies alcohol and substance use.  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

## 2020-12-21 NOTE — Discharge Instructions (Signed)

## 2020-12-31 DIAGNOSIS — F331 Major depressive disorder, recurrent, moderate: Secondary | ICD-10-CM | POA: Diagnosis not present

## 2020-12-31 DIAGNOSIS — F411 Generalized anxiety disorder: Secondary | ICD-10-CM | POA: Diagnosis not present

## 2020-12-31 DIAGNOSIS — F1011 Alcohol abuse, in remission: Secondary | ICD-10-CM | POA: Diagnosis not present

## 2021-01-27 DIAGNOSIS — F411 Generalized anxiety disorder: Secondary | ICD-10-CM | POA: Diagnosis not present

## 2021-01-27 DIAGNOSIS — F1011 Alcohol abuse, in remission: Secondary | ICD-10-CM | POA: Diagnosis not present

## 2021-01-27 DIAGNOSIS — F331 Major depressive disorder, recurrent, moderate: Secondary | ICD-10-CM | POA: Diagnosis not present

## 2021-01-27 DIAGNOSIS — F1411 Cocaine abuse, in remission: Secondary | ICD-10-CM | POA: Diagnosis not present

## 2021-02-04 DIAGNOSIS — F411 Generalized anxiety disorder: Secondary | ICD-10-CM | POA: Diagnosis not present

## 2021-02-13 ENCOUNTER — Ambulatory Visit: Payer: BC Managed Care – PPO | Admitting: Internal Medicine

## 2021-02-13 DIAGNOSIS — F411 Generalized anxiety disorder: Secondary | ICD-10-CM | POA: Diagnosis not present

## 2021-03-07 DIAGNOSIS — F411 Generalized anxiety disorder: Secondary | ICD-10-CM | POA: Diagnosis not present

## 2021-03-11 DIAGNOSIS — F411 Generalized anxiety disorder: Secondary | ICD-10-CM | POA: Diagnosis not present

## 2021-03-18 DIAGNOSIS — F411 Generalized anxiety disorder: Secondary | ICD-10-CM | POA: Diagnosis not present

## 2021-03-25 DIAGNOSIS — F411 Generalized anxiety disorder: Secondary | ICD-10-CM | POA: Diagnosis not present

## 2021-04-01 DIAGNOSIS — F411 Generalized anxiety disorder: Secondary | ICD-10-CM | POA: Diagnosis not present

## 2021-04-07 DIAGNOSIS — F411 Generalized anxiety disorder: Secondary | ICD-10-CM | POA: Diagnosis not present

## 2021-04-16 ENCOUNTER — Ambulatory Visit: Payer: BC Managed Care – PPO | Admitting: Internal Medicine

## 2021-04-21 DIAGNOSIS — F411 Generalized anxiety disorder: Secondary | ICD-10-CM | POA: Diagnosis not present

## 2021-04-28 DIAGNOSIS — F411 Generalized anxiety disorder: Secondary | ICD-10-CM | POA: Diagnosis not present

## 2021-05-01 DIAGNOSIS — F411 Generalized anxiety disorder: Secondary | ICD-10-CM | POA: Diagnosis not present

## 2021-05-01 DIAGNOSIS — R4189 Other symptoms and signs involving cognitive functions and awareness: Secondary | ICD-10-CM | POA: Diagnosis not present

## 2021-05-01 DIAGNOSIS — F410 Panic disorder [episodic paroxysmal anxiety] without agoraphobia: Secondary | ICD-10-CM | POA: Diagnosis not present

## 2021-05-01 DIAGNOSIS — Z0001 Encounter for general adult medical examination with abnormal findings: Secondary | ICD-10-CM | POA: Diagnosis not present

## 2021-05-01 DIAGNOSIS — R400 Somnolence: Secondary | ICD-10-CM | POA: Diagnosis not present

## 2021-05-01 DIAGNOSIS — E0500 Thyrotoxicosis with diffuse goiter without thyrotoxic crisis or storm: Secondary | ICD-10-CM | POA: Diagnosis not present

## 2021-05-01 DIAGNOSIS — R002 Palpitations: Secondary | ICD-10-CM | POA: Diagnosis not present

## 2021-05-05 DIAGNOSIS — F411 Generalized anxiety disorder: Secondary | ICD-10-CM | POA: Diagnosis not present

## 2021-05-08 DIAGNOSIS — G4730 Sleep apnea, unspecified: Secondary | ICD-10-CM | POA: Diagnosis not present

## 2021-05-13 DIAGNOSIS — F411 Generalized anxiety disorder: Secondary | ICD-10-CM | POA: Diagnosis not present

## 2021-05-15 DIAGNOSIS — R002 Palpitations: Secondary | ICD-10-CM | POA: Diagnosis not present

## 2021-05-15 DIAGNOSIS — F411 Generalized anxiety disorder: Secondary | ICD-10-CM | POA: Diagnosis not present

## 2021-05-15 DIAGNOSIS — F4312 Post-traumatic stress disorder, chronic: Secondary | ICD-10-CM | POA: Diagnosis not present

## 2021-05-15 DIAGNOSIS — F410 Panic disorder [episodic paroxysmal anxiety] without agoraphobia: Secondary | ICD-10-CM | POA: Diagnosis not present

## 2021-05-21 ENCOUNTER — Encounter: Payer: Self-pay | Admitting: Internal Medicine

## 2021-05-21 ENCOUNTER — Ambulatory Visit (INDEPENDENT_AMBULATORY_CARE_PROVIDER_SITE_OTHER): Payer: BC Managed Care – PPO | Admitting: Internal Medicine

## 2021-05-21 ENCOUNTER — Other Ambulatory Visit: Payer: Self-pay

## 2021-05-21 VITALS — BP 102/64 | HR 65 | Ht 65.5 in | Wt 120.8 lb

## 2021-05-21 DIAGNOSIS — E0590 Thyrotoxicosis, unspecified without thyrotoxic crisis or storm: Secondary | ICD-10-CM | POA: Diagnosis not present

## 2021-05-21 DIAGNOSIS — E0500 Thyrotoxicosis with diffuse goiter without thyrotoxic crisis or storm: Secondary | ICD-10-CM

## 2021-05-21 LAB — COMPREHENSIVE METABOLIC PANEL
ALT: 19 U/L (ref 0–35)
AST: 22 U/L (ref 0–37)
Albumin: 4.9 g/dL (ref 3.5–5.2)
Alkaline Phosphatase: 61 U/L (ref 39–117)
BUN: 20 mg/dL (ref 6–23)
CO2: 30 mEq/L (ref 19–32)
Calcium: 9.8 mg/dL (ref 8.4–10.5)
Chloride: 102 mEq/L (ref 96–112)
Creatinine, Ser: 0.87 mg/dL (ref 0.40–1.20)
GFR: 75.32 mL/min (ref >60.00)
Glucose, Bld: 90 mg/dL (ref 70–99)
Potassium: 4 mEq/L (ref 3.5–5.1)
Sodium: 139 mEq/L (ref 135–145)
Total Bilirubin: 0.7 mg/dL (ref 0.2–1.2)
Total Protein: 7.6 g/dL (ref 6.0–8.3)

## 2021-05-21 LAB — CBC WITH DIFFERENTIAL/PLATELET
Basophils Absolute: 0.1 10*3/uL (ref 0.0–0.1)
Basophils Relative: 1.3 % (ref 0.0–3.0)
Eosinophils Absolute: 0.1 10*3/uL (ref 0.0–0.7)
Eosinophils Relative: 2 % (ref 0.0–5.0)
HCT: 45.5 % (ref 36.0–46.0)
Hemoglobin: 14.9 g/dL (ref 12.0–15.0)
Lymphocytes Relative: 51 % — ABNORMAL HIGH (ref 12.0–46.0)
Lymphs Abs: 2.2 10*3/uL (ref 0.7–4.0)
MCHC: 32.8 g/dL (ref 30.0–36.0)
MCV: 88 fl (ref 78.0–100.0)
Monocytes Absolute: 0.3 10*3/uL (ref 0.1–1.0)
Monocytes Relative: 7.7 % (ref 3.0–12.0)
Neutro Abs: 1.7 10*3/uL (ref 1.4–7.7)
Neutrophils Relative %: 38 % — ABNORMAL LOW (ref 43.0–77.0)
Platelets: 226 10*3/uL (ref 150.0–400.0)
RBC: 5.17 Mil/uL — ABNORMAL HIGH (ref 3.87–5.11)
RDW: 13.3 % (ref 11.5–15.5)
WBC: 4.4 10*3/uL (ref 4.0–10.5)

## 2021-05-21 LAB — T4, FREE: Free T4: 0.74 ng/dL (ref 0.60–1.60)

## 2021-05-21 LAB — TSH: TSH: 5.57 u[IU]/mL — ABNORMAL HIGH (ref 0.35–5.50)

## 2021-05-21 MED ORDER — METHIMAZOLE 5 MG PO TABS
5.0000 mg | ORAL_TABLET | ORAL | 2 refills | Status: DC
Start: 2021-05-21 — End: 2021-08-07

## 2021-05-21 NOTE — Progress Notes (Signed)
Name: Stephanie Fisher  MRN/ DOB: [id number], 03-13-1966    Age/ Sex: 55 y.o., female     PCP: Hayden Rasmussen, MD   Reason for Endocrinology Evaluation: Hyperthyroidism      Initial Endocrinology Clinic Visit: 05/10/2020    PATIENT IDENTIFIER: Stephanie Fisher is a 55 y.o., female with unremarkable  past medical history . She has followed with [facility] since 05/10/2020 for consultative assistance with management of her hyperthyroidism      HISTORICAL SUMMARY: The patient was first diagnosed with Hyperthyroidism secondary to Graves' disease in 03/2020. A thyroid uptake and scan ( 03/2020 ) demonstrated a 24 hour I-123 uptake = 79.6%. Thyroid ultrasound did not reveal any nodules.   She had a loss summer 2020 and has cognitive issues as well as memory loss.   She has been on methimazole since 03/2020  She is adopted    Sees psychiatry , prozac exacerbated night symptoms.  SUBJECTIVE:    Today (05/21/2021):  Stephanie Fisher is here for a follow up on hyperthyroidism secondary to Graves' Disease.  She has not been to our clinic in 7 months.  Weight has been stable  Has alternating diarrhea and  diarrhea  Has noted right side asymmetry of the neck  She continues to have anxiety and sleep issues, she sees psychiatry as well as therapist, she has tried multiple medications in the past, due to multiple changes that she did not like she has opted not to take anything at this time. Has palpitations   She has been seeing a holistic physician, she has been diagnosed with cortisol abnormality, per patient she was told she has higher than normal morning cortisol and lower than normal nighttime cortisol's.  She is on supplements through her holistic doctor.  Methimazole 5 mg, daily   HISTORY:  Past Medical History:  Past Medical History:  Diagnosis Date   ADHD (attention deficit hyperactivity disorder)    Anxiety    Depression    Past Surgical History: No past  surgical history on file. Social History:  reports that she has never smoked. She has never used smokeless tobacco. She reports that she does not drink alcohol and does not use drugs. Family History:  Family History  Adopted: Yes     HOME MEDICATIONS: Allergies as of 05/21/2021   No Known Allergies      Medication List        Accurate as of May 21, 2021  5:25 PM. If you have any questions, ask your nurse or doctor.          STOP taking these medications    buPROPion 150 MG 24 hr tablet Commonly known as: WELLBUTRIN XL Stopped by: Dorita Sciara, MD       TAKE these medications    hydrOXYzine 10 MG tablet Commonly known as: ATARAX/VISTARIL Take by mouth.   methimazole 5 MG tablet Commonly known as: TAPAZOLE Take 1 tablet (5 mg total) by mouth daily.          OBJECTIVE:   PHYSICAL EXAM: VS: BP 102/64 (BP Location: Left Arm, Patient Position: Sitting, Cuff Size: Small)   Pulse 65   Ht 5' 5.5" (1.664 m)   Wt 120 lb 12.8 oz (54.8 kg)   LMP  (LMP Unknown) Comment: over 1 year  SpO2 99%   BMI 19.80 kg/m    EXAM: General: Pt appears well and is in NAD  Neck: General: Supple without adenopathy. Thyroid: Thyroid size normal.  No goiter or nodules appreciated.   Lungs: Clear with good BS bilat with no rales, rhonchi, or wheezes  Heart: Auscultation: RRR.  Abdomen: Normoactive bowel sounds, soft, nontender, without masses or organomegaly palpable  Extremities:  BL LE: No pretibial edema normal ROM and strength.  Mental Status: Judgment, insight: Intact Orientation: Oriented to time, place, and person Mood and affect: Patient became tearful in the office     DATA REVIEWED:  Results for Stephanie Fisher, Stephanie Fisher (MRN [id number]) as of 05/21/2021 17:34  Ref. Range 05/21/2021 14:22  Sodium Latest Ref Range: 135 - 145 mEq/L 139  Potassium Latest Ref Range: 3.5 - 5.1 mEq/L 4.0  Chloride Latest Ref Range: 96 - 112 mEq/L 102  CO2 Latest Ref Range: 19 - 32  mEq/L 30  Glucose Latest Ref Range: 70 - 99 mg/dL 90  BUN Latest Ref Range: 6 - 23 mg/dL 20  Creatinine Latest Ref Range: 0.40 - 1.20 mg/dL 7.06  Calcium Latest Ref Range: 8.4 - 10.5 mg/dL 9.8  Alkaline Phosphatase Latest Ref Range: 39 - 117 U/L 61  Albumin Latest Ref Range: 3.5 - 5.2 g/dL 4.9  AST Latest Ref Range: 0 - 37 U/L 22  ALT Latest Ref Range: 0 - 35 U/L 19  Total Protein Latest Ref Range: 6.0 - 8.3 g/dL 7.6  Total Bilirubin Latest Ref Range: 0.2 - 1.2 mg/dL 0.7  GFR Latest Ref Range: >60.00 mL/min 75.32  WBC Latest Ref Range: 4.0 - 10.5 K/uL 4.4  RBC Latest Ref Range: 3.87 - 5.11 Mil/uL 5.17 (H)  Hemoglobin Latest Ref Range: 12.0 - 15.0 g/dL 23.7  HCT Latest Ref Range: 36.0 - 46.0 % 45.5  MCV Latest Ref Range: 78.0 - 100.0 fl 88.0  MCHC Latest Ref Range: 30.0 - 36.0 g/dL 62.8  RDW Latest Ref Range: 11.5 - 15.5 % 13.3  Platelets Latest Ref Range: 150.0 - 400.0 K/uL 226.0  Neutrophils Latest Ref Range: 43.0 - 77.0 % 38.0 (L)  Lymphocytes Latest Ref Range: 12.0 - 46.0 % 51.0 (H)  Monocytes Relative Latest Ref Range: 3.0 - 12.0 % 7.7  Eosinophil Latest Ref Range: 0.0 - 5.0 % 2.0  Basophil Latest Ref Range: 0.0 - 3.0 % 1.3  NEUT# Latest Ref Range: 1.4 - 7.7 K/uL 1.7  Lymphocyte # Latest Ref Range: 0.7 - 4.0 K/uL 2.2  Monocyte # Latest Ref Range: 0.1 - 1.0 K/uL 0.3  Eosinophils Absolute Latest Ref Range: 0.0 - 0.7 K/uL 0.1  Basophils Absolute Latest Ref Range: 0.0 - 0.1 K/uL 0.1  TSH Latest Ref Range: 0.35 - 5.50 uIU/mL 5.57 (H)  T4,Free(Direct) Latest Ref Range: 0.60 - 1.60 ng/dL 3.15      Thyroid Uptake and Scan 04/18/2020  Homogeneous tracer distribution in both thyroid lobes.   No focal areas of increased or decreased tracer localization.   4 hour I-123 uptake = 45.2% (normal 5-20%)   24 hour I-123 uptake = 79.6% (normal 10-30%)   IMPRESSION: Normal thyroid scan.   Markedly elevated 4 hour and 24 hour radio iodine uptakes.   Findings consistent with Graves  disease.  Thyroid ultrasound 03/27/2020  No discrete nodules are seen within the thyroid gland. The thyroid parenchyma is very hypervascular bilaterally. No abnormal lymph nodes identified.   IMPRESSION: Thyroid parenchyma is heterogeneous and very hypervascular without focal nodules. Overall thyroid gland volume is within normal limits. Increased vascularity can be suggestive of thyroiditis.  ASSESSMENT / PLAN / RECOMMENDATIONS:   Hyperthyroidism secondary to Graves' Disease:   - Pt is  clinically euthyroid except for anxiety and insomnia  - She is tolerating methimazole without side effects -TSH elevated, will make the following changes  Medications   Methimazole 5 mg , half tablet 3 days a week, 1 tablet the other 4 days of the week     2. Graves' Disease:  - No extra thyroidal manifestations of Graves' disease  3.  Abnormal cortisol:  -This is  through holistic doctor.  I explained to the patient there's a difference in practice between conventional medicine and holistic medicine.  At this time there is no clinical S/S adrenal insufficiency nor Cushing syndrome.  I also explained to the patient that certain natural supplements may interfere with conventional testing of her cortisol -I have encouraged her to continue to follow-up with her holistic doctor at this time.  I will be happy to test her properly for cortisol once she has been OFF all supplements for at least 3 months      F/U in 4 months  Labs in 8 weeks    Signed electronically by: Mack Guise, MD  [facility]  [facility] [address] [place], [place] [address] Phone: [phone] FAX: [phone]      CC: Hayden Rasmussen, MD [place] [address] Phone: [phone]  Fax: [phone]   Return to Endocrinology clinic as below: Future Appointments  Date Time Provider [place]  11/19/2021  1:40 PM  Jazz Biddy, Melanie Crazier, MD [id number] None

## 2021-05-22 DIAGNOSIS — F411 Generalized anxiety disorder: Secondary | ICD-10-CM | POA: Diagnosis not present

## 2021-05-26 DIAGNOSIS — F411 Generalized anxiety disorder: Secondary | ICD-10-CM | POA: Diagnosis not present

## 2021-05-28 ENCOUNTER — Other Ambulatory Visit: Payer: Self-pay

## 2021-05-28 ENCOUNTER — Ambulatory Visit: Payer: BC Managed Care – PPO | Admitting: Sports Medicine

## 2021-05-28 VITALS — BP 100/60 | HR 66 | Ht 65.5 in | Wt 116.0 lb

## 2021-05-28 DIAGNOSIS — M542 Cervicalgia: Secondary | ICD-10-CM | POA: Diagnosis not present

## 2021-05-28 DIAGNOSIS — M546 Pain in thoracic spine: Secondary | ICD-10-CM

## 2021-05-28 DIAGNOSIS — M9908 Segmental and somatic dysfunction of rib cage: Secondary | ICD-10-CM

## 2021-05-28 DIAGNOSIS — M9901 Segmental and somatic dysfunction of cervical region: Secondary | ICD-10-CM | POA: Diagnosis not present

## 2021-05-28 DIAGNOSIS — M9905 Segmental and somatic dysfunction of pelvic region: Secondary | ICD-10-CM

## 2021-05-28 DIAGNOSIS — M9902 Segmental and somatic dysfunction of thoracic region: Secondary | ICD-10-CM

## 2021-05-28 DIAGNOSIS — M9903 Segmental and somatic dysfunction of lumbar region: Secondary | ICD-10-CM

## 2021-05-28 DIAGNOSIS — M545 Low back pain, unspecified: Secondary | ICD-10-CM

## 2021-05-28 DIAGNOSIS — G8929 Other chronic pain: Secondary | ICD-10-CM

## 2021-05-28 NOTE — Patient Instructions (Addendum)
Good to see you   See me again in 4 weeks for repeat OMT

## 2021-05-28 NOTE — Progress Notes (Signed)
Stephanie Fisher D.[facility] [address] [place] [address] Phone: [phone]   Assessment and Plan:     1. Neck pain 2. Chronic bilateral thoracic back pain 3. Chronic bilateral low back pain without sciatica 4. Somatic dysfunction of cervical region 5. Somatic dysfunction of thoracic region 6. Somatic dysfunction of lumbar region 7. Somatic dysfunction of pelvic region 8. Somatic dysfunction of rib region -Chronic with exacerbation, initial visit - Multiple musculoskeletal complaints, worse on right side of neck, thoracic spine, lumbar spine with inconsistent neuropathic complaints.  No red flag signs on physical exam, so no additional imaging obtained - Discussed with patient that her anxiety likely has a large contributing factor to musculoskeletal tension and pain.  Recommend continued therapy - Patient elects for OMT today.  Tolerated well per note below. - Decision today to treat with OMT was based on Physical Exam  After verbal consent patient was treated with HVLA (high velocity low amplitude), ME (muscle energy), FPR (flex positional release), ST (soft tissue), PC/PD (Pelvic Compression/ Pelvic Decompression) techniques in cervical, rib, thoracic, lumbar, and pelvic areas. Patient tolerated the procedure well with improvement in symptoms.  Patient educated on potential side effects of soreness and recommended to rest, hydrate, and use Tylenol as needed for pain control.     Pertinent previous records reviewed include MRI brain 11/02/2020, MRI C-spine 11/02/2020, CT head 01/01/2020   Follow Up: 4 weeks for repeat OMT   Subjective:   I, Stephanie Fisher, am serving as a scribe for Dr. Richardean Sale  Chief Complaint: back pain   HPI:   05/28/21 Patient is a 55 year old female presenting with back pain. Patient states that she has been having a lot of trauma in the past 2 years and she has been experiencing entire back pain. in  sacrum area she is having numbness and in her upper neck has lots of tension and now is having numbness. Patient has prickly burning now numbness pain in mid back, patient also states that she has prickly numbness feeling on the top of her head. Patient states that couple of summers ago she had this weird whole body nerve "shock" feeling where she was nervous she had a stroke or something, she woke up very disorientated and confused. Since then all these different numbness, tingling, and burning sensation mostly on the right side, but does have some left side pain and numbness. Patient did have a neck and brain MRI in April that came back normal. Patient was referred by a friend to see if OMT could help with the back and neck pain. Patients anxiety makes the pain worse and wakes up very tense in the middle of the night. Patient states that she does see a therapist and has a psychiatrist.   Numbness/tingling: yes in both arms and legs  Treatments tried:    Relevant Historical Information: Anxiety, Graves' disease  Additional pertinent review of systems negative.   Current Outpatient Medications:    methimazole (TAPAZOLE) 5 MG tablet, Take 1 tablet (5 mg total) by mouth as directed. Half a tablet 3 days a week, 1 tablet the rest of the week, Disp: 72 tablet, Rfl: 2   hydrOXYzine (ATARAX/VISTARIL) 10 MG tablet, Take by mouth. (Patient not taking: Reported on 05/28/2021), Disp: , Rfl:    Objective:     Vitals:   05/28/21 1103  BP: 100/60  Pulse: 66  SpO2: 99%  Weight: 116 lb (52.6 kg)  Height: 5' 5.5" (  1.664 m)      Body mass index is 19.01 kg/m.    Physical Exam:    General: Well-appearing, cooperative, sitting comfortably in no acute distress.   OMT Physical Exam:  ASIS Compression Test: Positive Right Cervical: TTP paraspinal, C3 RRSL Rib: Inhalation dysfunction ribs 4-6 Thoracic: TTP paraspinal, T4-6 RRSL Lumbar: TTP paraspinal, L1-3 RRSL Pelvis: Right anterior innominate     Electronically signed by:  Stephanie Fisher [facility] 12:08 PM 05/28/21

## 2021-06-02 DIAGNOSIS — F411 Generalized anxiety disorder: Secondary | ICD-10-CM | POA: Diagnosis not present

## 2021-06-02 DIAGNOSIS — R400 Somnolence: Secondary | ICD-10-CM | POA: Diagnosis not present

## 2021-06-02 DIAGNOSIS — R4189 Other symptoms and signs involving cognitive functions and awareness: Secondary | ICD-10-CM | POA: Diagnosis not present

## 2021-06-02 DIAGNOSIS — G4733 Obstructive sleep apnea (adult) (pediatric): Secondary | ICD-10-CM | POA: Diagnosis not present

## 2021-06-05 ENCOUNTER — Ambulatory Visit: Payer: BC Managed Care – PPO | Admitting: Sports Medicine

## 2021-06-09 DIAGNOSIS — F411 Generalized anxiety disorder: Secondary | ICD-10-CM | POA: Diagnosis not present

## 2021-06-13 DIAGNOSIS — F411 Generalized anxiety disorder: Secondary | ICD-10-CM | POA: Diagnosis not present

## 2021-06-13 DIAGNOSIS — G4733 Obstructive sleep apnea (adult) (pediatric): Secondary | ICD-10-CM | POA: Diagnosis not present

## 2021-06-13 DIAGNOSIS — F4312 Post-traumatic stress disorder, chronic: Secondary | ICD-10-CM | POA: Diagnosis not present

## 2021-06-13 DIAGNOSIS — E0500 Thyrotoxicosis with diffuse goiter without thyrotoxic crisis or storm: Secondary | ICD-10-CM | POA: Diagnosis not present

## 2021-06-16 DIAGNOSIS — F411 Generalized anxiety disorder: Secondary | ICD-10-CM | POA: Diagnosis not present

## 2021-06-23 DIAGNOSIS — F411 Generalized anxiety disorder: Secondary | ICD-10-CM | POA: Diagnosis not present

## 2021-06-23 NOTE — Progress Notes (Signed)
Aleen Sells D.[facility] [address] [place] [address] Phone: [phone]   Assessment and Plan:     1. Chronic bilateral low back pain without sciatica -Chronic with exacerbation, subsequent visit - Unclear etiology of chronic low back pain that is likely multifactorial with patient ongoing investigation of mental health symptoms, decreased physical activity and weakening back muscles - No red flag symptoms at today's visit - X-ray obtained at today's visit.  My interpretation: No acute fracture.  Decreased disc space between L5-S1, L4-S5, cortical changes along left vertebrae between L4-L5.  Generalized decreased lordotic curve - Recommend following with psychiatry and discussing potential medication duloxetine as this can be used for multiple mental health disorders as well as fibromyalgia - Use Tylenol for day-to-day pain and NSAIDs as needed for breakthrough pain - Patient is unsure if she received any benefit from OMT at prior office visit, so OMT was not repeated today.  Could be considered in the future. - Ambulatory referral to Physical Therapy    Pertinent previous records reviewed include none   Follow Up: 4 weeks for reevaluation.  Discussion of physical therapy benefit, Tylenol/NSAID use for pain control, and update on mental health work-up   Subjective:   I, Moenique Parris, am serving as a Neurosurgeon for Doctor Richardean Sale  Chief Complaint: OMT follow up  HPI:   05/28/21 Patient is a 55 year old female presenting with back pain. Patient states that she has been having a lot of trauma in the past 2 years and she has been experiencing entire back pain. in sacrum area she is having numbness and in her upper neck has lots of tension and now is having numbness. Patient has prickly burning now numbness pain in mid back, patient also states that she has prickly numbness feeling on the top of her head. Patient states that couple of summers ago  she had this weird whole body nerve "shock" feeling where she was nervous she had a stroke or something, she woke up very disorientated and confused. Since then all these different numbness, tingling, and burning sensation mostly on the right side, but does have some left side pain and numbness. Patient did have a neck and brain MRI in April that came back normal. Patient was referred by a friend to see if OMT could help with the back and neck pain. Patients anxiety makes the pain worse and wakes up very tense in the middle of the night. Patient states that she does see a therapist and has a psychiatrist.    Numbness/tingling: yes in both arms and legs  Treatments tried:   06/24/21 Patient states feels okay could always feel better has some SI joint pain, spinal collum has some numbness tingling, had a big flare up Sunday night bilaterally down legs with some stiffness and ache . Continues to have brain fogg and a level of anxiety   Relevant Historical Information:  Anxiety, Graves' disease  Additional pertinent review of systems negative.  Current Outpatient Medications  Medication Sig Dispense Refill   divalproex (DEPAKOTE SPRINKLE) 125 MG capsule Take by mouth 2 (two) times daily.     hydrOXYzine (ATARAX/VISTARIL) 10 MG tablet Take by mouth.     methimazole (TAPAZOLE) 5 MG tablet Take 1 tablet (5 mg total) by mouth as directed. Half a tablet 3 days a week, 1 tablet the rest of the week 72 tablet 2   No current facility-administered medications for this visit.  Objective:     Vitals:   06/24/21 1430  BP: 110/70  Pulse: 72  SpO2: 97%  Weight: 116 lb (52.6 kg)  Height: 5\' 5"  (1.651 m)      Body mass index is 19.3 kg/m.    Physical Exam:     Gen: Appears well, nad, nontoxic and pleasant Psych: Alert and oriented, appropriate mood and affect Neuro: sensation intact, strength is 5/5 in upper and lower extremities, muscle tone wnl Skin: no susupicious lesions or rashes  Back  - Normal skin, Spine with normal alignment and no deformity.   No tenderness to vertebral process palpation.   Paraspinous muscles are bilaterally tender and without spasm Straight leg raise negative Trendelenberg negative   Electronically signed by:  [facility] 3:16 PM 06/24/21

## 2021-06-24 ENCOUNTER — Other Ambulatory Visit: Payer: Self-pay

## 2021-06-24 ENCOUNTER — Ambulatory Visit: Payer: BC Managed Care – PPO | Admitting: Sports Medicine

## 2021-06-24 ENCOUNTER — Ambulatory Visit (INDEPENDENT_AMBULATORY_CARE_PROVIDER_SITE_OTHER): Payer: BC Managed Care – PPO

## 2021-06-24 VITALS — BP 110/70 | HR 72 | Ht 65.0 in | Wt 116.0 lb

## 2021-06-24 DIAGNOSIS — M545 Low back pain, unspecified: Secondary | ICD-10-CM

## 2021-06-24 DIAGNOSIS — G8929 Other chronic pain: Secondary | ICD-10-CM | POA: Diagnosis not present

## 2021-06-24 NOTE — Patient Instructions (Addendum)
Good to see you  Referral to PT placed  Get Lumbar spine xray on your way out  Discussed Duloxetine for potential treatment for anxiety, depression, and fibro. Follow up in 4 weeks

## 2021-06-26 DIAGNOSIS — F331 Major depressive disorder, recurrent, moderate: Secondary | ICD-10-CM | POA: Diagnosis not present

## 2021-06-26 DIAGNOSIS — F411 Generalized anxiety disorder: Secondary | ICD-10-CM | POA: Diagnosis not present

## 2021-06-26 DIAGNOSIS — F1411 Cocaine abuse, in remission: Secondary | ICD-10-CM | POA: Diagnosis not present

## 2021-06-26 DIAGNOSIS — F1011 Alcohol abuse, in remission: Secondary | ICD-10-CM | POA: Diagnosis not present

## 2021-06-30 DIAGNOSIS — F411 Generalized anxiety disorder: Secondary | ICD-10-CM | POA: Diagnosis not present

## 2021-07-02 DIAGNOSIS — F411 Generalized anxiety disorder: Secondary | ICD-10-CM | POA: Diagnosis not present

## 2021-07-07 DIAGNOSIS — F411 Generalized anxiety disorder: Secondary | ICD-10-CM | POA: Diagnosis not present

## 2021-07-17 DIAGNOSIS — G4733 Obstructive sleep apnea (adult) (pediatric): Secondary | ICD-10-CM | POA: Diagnosis not present

## 2021-07-17 DIAGNOSIS — F411 Generalized anxiety disorder: Secondary | ICD-10-CM | POA: Diagnosis not present

## 2021-07-22 DIAGNOSIS — F411 Generalized anxiety disorder: Secondary | ICD-10-CM | POA: Diagnosis not present

## 2021-07-28 ENCOUNTER — Ambulatory Visit: Payer: BC Managed Care – PPO

## 2021-07-28 DIAGNOSIS — F411 Generalized anxiety disorder: Secondary | ICD-10-CM | POA: Diagnosis not present

## 2021-07-29 ENCOUNTER — Other Ambulatory Visit: Payer: Self-pay

## 2021-07-29 ENCOUNTER — Ambulatory Visit: Payer: BC Managed Care – PPO | Admitting: Physical Therapy

## 2021-07-29 DIAGNOSIS — F331 Major depressive disorder, recurrent, moderate: Secondary | ICD-10-CM | POA: Diagnosis not present

## 2021-07-29 DIAGNOSIS — F1411 Cocaine abuse, in remission: Secondary | ICD-10-CM | POA: Diagnosis not present

## 2021-07-29 DIAGNOSIS — F411 Generalized anxiety disorder: Secondary | ICD-10-CM | POA: Diagnosis not present

## 2021-07-29 DIAGNOSIS — F1011 Alcohol abuse, in remission: Secondary | ICD-10-CM | POA: Diagnosis not present

## 2021-07-29 NOTE — Therapy (Incomplete)
OUTPATIENT PHYSICAL THERAPY THORACOLUMBAR EVALUATION   Patient Name: Stephanie Fisher Anne Holdman MRN: [id number] DOB:08/10/1965, 56 y.o., female Today's Date: 07/29/2021    Past Medical History:  Diagnosis Date   ADHD (attention deficit hyperactivity disorder)    Anxiety    Depression    No past surgical history on file. Patient Active Problem List   Diagnosis Date Noted   Graves disease 06/18/2020   Anxiety 05/10/2020   Depressive disorder 05/10/2020   Hyperthyroidism 05/10/2020   Attention deficit hyperactivity disorder (ADHD) 01/30/2020    PCP: Dois Davenportichter, Karen L, MD  REFERRING PROVIDER: Richardean SaleJackson, Benjamin, DO  REFERRING DIAG: Chronic bilateral low back pain without sciatica  THERAPY DIAG:  No diagnosis found.  ONSET DATE: ***  SUBJECTIVE:                                                                                                                                                                                          SUBJECTIVE STATEMENT: ***  PERTINENT HISTORY:  Anxiety  PAIN:  Are you having pain? Yes VAS scale: ***/10 Pain location: *** Pain orientation: {Pain Orientation:25161}  PAIN TYPE: Chronic Pain description: {PAIN DESCRIPTION:21022940}  Aggravating factors: *** Relieving factors: ***  PRECAUTIONS: None  WEIGHT BEARING RESTRICTIONS No  FALLS:  Has patient fallen in last 6 months? No  LIVING ENVIRONMENT: Lives with: {[facility] lives with:25569::"lives with their family"} Lives in: {Lives in:25570} Stairs: {yes/no:20286}; {Stairs:24000} Has following equipment at home: {Assistive devices:23999}  OCCUPATION: ***  PLOF: Independent  PATIENT GOALS Pain relief and ***   OBJECTIVE:  DIAGNOSTIC FINDINGS:  ***  PATIENT SURVEYS:  FOTO ***  SCREENING FOR RED FLAGS: Negative  COGNITION:  Overall cognitive status: {cognition:24006}     SENSATION:  Light touch: {intact/deficits:24005}  MUSCLE LENGTH: ***  POSTURE:   ***  PALPATION: ***  LUMBAR AROM  A/PROM A/PROM  07/29/2021  Flexion   Extension   Right lateral flexion   Left lateral flexion   Right rotation   Left rotation    CERVICAL AROM  A/PROM A/PROM (deg) 07/29/2021  Flexion   Extension   Right lateral flexion   Left lateral flexion   Right rotation   Left rotation    UE AROM/PROM:  A/PROM Right 07/29/2021 Left 07/29/2021  Shoulder flexion    Shoulder extension    Shoulder abduction    Shoulder adduction    Shoulder extension    Shoulder internal rotation    Shoulder external rotation    Elbow flexion    Elbow extension    Wrist flexion    Wrist extension    Wrist ulnar deviation    Wrist radial deviation  Wrist pronation    Wrist supination     UE MMT:  MMT Right 07/29/2021 Left 07/29/2021  Shoulder flexion    Shoulder extension    Shoulder abduction    Shoulder adduction    Shoulder extension    Middle trapezius    Lower trapezius    Elbow flexion    Elbow extension    Wrist flexion    Wrist extension    Wrist ulnar deviation    Wrist radial deviation    Wrist pronation    Wrist supination    Grip strength     LE AROM/PROM:  A/PROM Right 07/29/2021 Left 07/29/2021  Hip flexion    Hip extension    Hip abduction    Hip adduction    Hip internal rotation    Hip external rotation    Knee flexion    Knee extension    Ankle dorsiflexion    Ankle plantarflexion    Ankle inversion    Ankle eversion      LE MMT:  MMT Right 07/29/2021 Left 07/29/2021  Hip flexion    Hip extension    Hip abduction    Hip adduction    Hip internal rotation    Hip external rotation    Knee flexion    Knee extension    Ankle dorsiflexion    Ankle plantarflexion    Ankle inversion    Ankle eversion     FUNCTIONAL TESTS:  {Functional tests:24029}  GAIT: Distance walked: *** Assistive device utilized: {Assistive devices:23999} Level of assistance: {Levels of assistance:24026} Comments:  ***   TODAY'S TREATMENT  ***   PATIENT EDUCATION:  Education details: Exam findings, POC, HEP Person educated: Patient Education method: Explanation, Demonstration, Tactile cues, Verbal cues, and Handouts Education comprehension: verbalized understanding, returned demonstration, verbal cues required, tactile cues required, and needs further education  HOME EXERCISE PROGRAM: ***   ASSESSMENT: CLINICAL IMPRESSION: Patient is a 56 y.o. female who was seen today for physical therapy evaluation and treatment for ***. Objective impairments include {opptimpairments:25111}. These impairments are limiting patient from {activity limitations:25113}. Personal factors including {Personal factors:25162} are also affecting patient's functional outcome. Patient will benefit from skilled PT to address above impairments and improve overall function.  REHAB POTENTIAL: {rehabpotential:25112}  CLINICAL DECISION MAKING: {clinical decision making:25114}  EVALUATION COMPLEXITY: {Evaluation complexity:25115}   GOALS: Goals reviewed with patient? Yes  SHORT TERM GOALS:  STG Name Target Date Goal status  1 Patient will be I with initial HEP in order to progress with therapy. Baseline:  {follow up:25551} {GOALSTATUS:25110}  2 PT will review FOTO with patient by 3rd visit in order to understand expected progress and outcome with therapy. Baseline:  {follow up:25551} {GOALSTATUS:25110}  3 *** Baseline: {follow up:25551} {GOALSTATUS:25110}  4 *** Baseline: {follow up:25551} {GOALSTATUS:25110}  5 *** Baseline: {follow up:25551} {GOALSTATUS:25110}  6 *** Baseline: {follow up:25551} {GOALSTATUS:25110}  7 *** Baseline: {follow up:25551} {GOALSTATUS:25110}   LONG TERM GOALS:   LTG Name Target Date Goal status  1 Patient will be I with final HEP to maintain progress from PT. Baseline: {follow up:25551} {GOALSTATUS:25110}  2 Patient will report >/= ***% status on FOTO to indicate improved functional  ability. Baseline: {follow up:25551} {GOALSTATUS:25110}  3 *** Baseline: {follow up:25551} {GOALSTATUS:25110}  4 *** Baseline: {follow up:25551} {GOALSTATUS:25110}  5 *** Baseline: {follow up:25551} {GOALSTATUS:25110}  6 *** Baseline: {follow up:25551} {GOALSTATUS:25110}  7 *** Baseline: {follow up:25551} {GOALSTATUS:25110}   PLAN: PT FREQUENCY: {rehab frequency:25116}  PT DURATION: {rehab duration:25117}  PLANNED INTERVENTIONS: {  rehab planned interventions:25118::"Therapeutic exercises","Therapeutic activity","Neuro Muscular re-education","Balance training","Gait training","Patient/Family education","Joint mobilization"}  PLAN FOR NEXT SESSION: Review HEP and progress PRN, ***   Rosana Hoes, PT, DPT, LAT, ATC 07/29/21  11:14 AM Phone: [phone] Fax: [phone]

## 2021-07-30 NOTE — Progress Notes (Deleted)
Aleen Sells D[facility] [address] [place] [address] Phone: [phone]   Assessment and Plan:     There are no diagnoses linked to this encounter.  *** - Patient has received significant relief with OMT in the past.  Elects for repeat OMT today.  Tolerated well per note below. - Decision today to treat with OMT was based on Physical Exam   After verbal consent patient was treated with HVLA (high velocity low amplitude), ME (muscle energy), FPR (flex positional release), ST (soft tissue), PC/PD (Pelvic Compression/ Pelvic Decompression) techniques in cervical, rib, thoracic, lumbar, and pelvic areas. Patient tolerated the procedure well with improvement in symptoms.  Patient educated on potential side effects of soreness and recommended to rest, hydrate, and use Tylenol as needed for pain control.   Pertinent previous records reviewed include ***   Follow Up: ***     Subjective:   I, Stephanie Fisher, am serving as a Neurosurgeon for Doctor Richardean Sale  Chief Complaint: OMT follow up sciatic pain   HPI:    05/28/21 Patient is a 56 year old female presenting with back pain. Patient states that she has been having a lot of trauma in the past 2 years and she has been experiencing entire back pain. in sacrum area she is having numbness and in her upper neck has lots of tension and now is having numbness. Patient has prickly burning now numbness pain in mid back, patient also states that she has prickly numbness feeling on the top of her head. Patient states that couple of summers ago she had this weird whole body nerve "shock" feeling where she was nervous she had a stroke or something, she woke up very disorientated and confused. Since then all these different numbness, tingling, and burning sensation mostly on the right side, but does have some left side pain and numbness. Patient did have a neck and brain MRI in April that came back normal. Patient was  referred by a friend to see if OMT could help with the back and neck pain. Patients anxiety makes the pain worse and wakes up very tense in the middle of the night. Patient states that she does see a therapist and has a psychiatrist.    Numbness/tingling: yes in both arms and legs  Treatments tried:    06/24/21 Patient states feels okay could always feel better has some SI joint pain, spinal collum has some numbness tingling, had a big flare up Sunday night bilaterally down legs with some stiffness and ache . Continues to have brain fogg and a level of anxiety   07/31/2021 Patient states   Relevant Historical Information:  Anxiety, Graves' disease  Additional pertinent review of systems negative.  Current Outpatient Medications  Medication Sig Dispense Refill   divalproex (DEPAKOTE SPRINKLE) 125 MG capsule Take by mouth 2 (two) times daily.     hydrOXYzine (ATARAX/VISTARIL) 10 MG tablet Take by mouth.     methimazole (TAPAZOLE) 5 MG tablet Take 1 tablet (5 mg total) by mouth as directed. Half a tablet 3 days a week, 1 tablet the rest of the week 72 tablet 2   No current facility-administered medications for this visit.      Objective:     There were no vitals filed for this visit.    There is no height or weight on file to calculate BMI.    Physical Exam:     General: Well-appearing, cooperative, sitting comfortably in no acute  distress.   OMT Physical Exam:  ASIS Compression Test: Positive Right Cervical: TTP paraspinal, *** Rib: Bilateral elevated first rib with TTP Thoracic: TTP paraspinal,*** Lumbar: TTP paraspinal,*** Pelvis: Right anterior innominate  Electronically signed by:  Aleen [facility]ells [facility] 10:54 AM 07/30/21

## 2021-07-31 ENCOUNTER — Ambulatory Visit: Payer: BC Managed Care – PPO | Admitting: Sports Medicine

## 2021-08-04 DIAGNOSIS — F411 Generalized anxiety disorder: Secondary | ICD-10-CM | POA: Diagnosis not present

## 2021-08-06 DIAGNOSIS — L820 Inflamed seborrheic keratosis: Secondary | ICD-10-CM | POA: Diagnosis not present

## 2021-08-07 ENCOUNTER — Telehealth: Payer: Self-pay | Admitting: Internal Medicine

## 2021-08-07 MED ORDER — METHIMAZOLE 5 MG PO TABS: 5.0000 mg | ORAL_TABLET | ORAL | 2 refills | Status: DC

## 2021-08-07 NOTE — Telephone Encounter (Signed)
MEDICATION: methimazole (TAPAZOLE) 5 MG tablet  PHARMACY:   [id number] - [place], [place] - [address] AT [facility] [address] Phone:  [phone]  Fax:  [phone]      HAS THE PATIENT CONTACTED THEIR PHARMACY?  yes  IS THIS A 90 DAY SUPPLY : YES  IS PATIENT OUT OF MEDICATION: YES  IF NOT; HOW MUCH IS LEFT:   LAST APPOINTMENT DATE: @11 /08/2020  NEXT APPOINTMENT DATE:@4 /09/2021  DO WE HAVE YOUR PERMISSION TO LEAVE A DETAILED MESSAGE?:  OTHER COMMENTS:    **Let patient know to contact pharmacy at the end of the day to make sure medication is ready. **  ** Please notify patient to allow 48-72 hours to process**  **Encourage patient to contact the pharmacy for refills or they can request refills through [facility]**

## 2021-08-11 DIAGNOSIS — F411 Generalized anxiety disorder: Secondary | ICD-10-CM | POA: Diagnosis not present

## 2021-08-17 DIAGNOSIS — G4733 Obstructive sleep apnea (adult) (pediatric): Secondary | ICD-10-CM | POA: Diagnosis not present

## 2021-08-18 DIAGNOSIS — F411 Generalized anxiety disorder: Secondary | ICD-10-CM | POA: Diagnosis not present

## 2021-08-25 DIAGNOSIS — F411 Generalized anxiety disorder: Secondary | ICD-10-CM | POA: Diagnosis not present

## 2021-08-30 DIAGNOSIS — F411 Generalized anxiety disorder: Secondary | ICD-10-CM | POA: Diagnosis not present

## 2021-08-31 ENCOUNTER — Ambulatory Visit (HOSPITAL_COMMUNITY)
Admission: EM | Admit: 2021-08-31 | Discharge: 2021-08-31 | Disposition: A | Discharge status: Home / Self Care | Payer: BC Managed Care – PPO | Attending: Physician Assistant | Admitting: Physician Assistant

## 2021-08-31 DIAGNOSIS — F339 Major depressive disorder, recurrent, unspecified: Secondary | ICD-10-CM | POA: Diagnosis not present

## 2021-08-31 DIAGNOSIS — F411 Generalized anxiety disorder: Secondary | ICD-10-CM

## 2021-08-31 MED ORDER — FLUOXETINE HCL 10 MG PO CAPS: 10.0000 mg | ORAL_CAPSULE | Freq: Every day | ORAL | 0 refills | Status: DC

## 2021-08-31 NOTE — ED Provider Notes (Signed)
Behavioral Health Urgent Care Medical Screening Exam  Patient Name: Stephanie Fisher MRN: [id number] Date of Evaluation: 09/01/21 Chief Complaint: Anxiety Diagnosis:  Final diagnoses:  Generalized anxiety disorder  Episode of recurrent major depressive disorder, unspecified depression episode severity (HCC)    History of Present illness: Stephanie Fisher is a 56 y.o. female with a past psychiatric history significant for generalized anxiety disorder who presents to [facility] due to worsening anxiety and depressive symptoms.  When asked the reason for the visit today, patient replied "I felt like I needed to address that I have been experiencing a lot of intrusive thoughts and being stuck in my head almost to the point of initiating something, but my mind stops me before doing anything."  Patient states that during her episodes of intrusive thoughts, she often experiences feeling detached from her body.  She reports trying to decipher between whether or not her intrusive thoughts are attributed to her past trauma or her obsessive-compulsive disorder.  Patient endorses worsening anxiety and states that she has had a past history significant for anxiety that has been going on for quite some time.  Patient states that she has also been experiencing cognitive issues characterized by memory problems, difficulty planning things, and lack of motivation.  Patient states that she is able to get work done but occasionally gets sidetracked by her thoughts.  Patient states that she has been robbed a lot of time by being stuck in her head and trying to diagnose her issues.  Patient states that she also has been experiencing vivid dreams.  Whenever she wakes up from her dreams patient states that she can feel what seems like a surge of cortisol running through her.  Patient is fearful of the possible damage being done to her brain due to elevated cortisol as well as lack of sleep.   Patient states that she has been dealing with insomnia and sleep disturbances over the past 2 years and rarely remembers a time where she has experienced quality sleep.  Patient states that many of her symptoms can be traced back 2 years ago when she lost her son.  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  Patient is unsure if her symptoms are attributed to her past trauma dealing with substance abuse.  Patient states that she used to attend AA meetings but has not been going for a couple years due to being uncomfortable in her own skin while at the meetings.  In regards to her anxiety, patient has been experiencing anxiety for a while that has worsened and changed forms over the last couple years.  Patient also endorses depressive symptoms: feelings of sadness, lack of motivation, decreased energy, decreased concentration, difficulty getting out of bed, feelings of guilt/worthlessness, hopelessness, and irritability.  She states that she often has to put forth great effort to get out of bed.  Patient denies relief from her symptoms through utilizing coping skills.  Patient endorses having a therapist and states that her next appointment scheduled for tomorrow.  Patient is being followed by Dr. Jannifer Franklin at [facility].  Patient is currently taking buspirone 10 mg 2 times daily for the management of her anxiety, been on the medication for a month.  Patient states that she has been on gabapentin in the past for the management of her anxiety but states that it was not very effective.  Patient states that she also been on Lexapro and Celexa for the management of depression.  Patient is alert and oriented x4, calm, cooperative, and fully engaged in conversation during the encounter.  Patient denies suicidal ideations stating "I do not want to die but sure as heck do not want to live like this."  Patient denies  having any specific plans for self.  Patient denies homicidal ideations.  She further denies active auditory or visual hallucinations but is worried that her intrusive thoughts may be a form of auditory hallucinations.  Patient endorses fair sleep and receives on average 6 to 8 hours of sleep each night.  Patient denies having quality sleep and states that she feels like she dies when going to sleep.  Patient states that whenever she wakes up in "freeze mode" she often utilizes headphones to help her get out of her own head.  Patient endorses decreased appetite and eats on average 2 meals per day.  Patient denies alcohol consumption, tobacco use, and illicit drug use.  Patient has a past history of using cocaine and having an eating disorder.  Patient denies past history of hospitalization due to mental health and denies a past history of self-injurious behavior.  Psychiatric Specialty Exam  Presentation  General Appearance:Appropriate for Environment; Well Groomed  Eye Contact:Good  Speech:Clear and Coherent; Normal Rate  Speech Volume:Normal  Handedness:Right   Mood and Affect  Mood:Anxious; Depressed; Worthless  Affect:Congruent   Thought Process  Thought Processes:Goal Directed; Coherent  Descriptions of Associations:Tangential  Orientation:Full (Time, Place and Person)  Thought Content:Logical; WDL    Hallucinations:None  Ideas of Reference:None  Suicidal Thoughts:No  Homicidal Thoughts:No   Sensorium  Memory:Immediate Good; Recent Good; Remote Fair (Patient endorses some impairment associated with her cognitive functioning)  Judgment:Good  Insight:Good   Executive Functions  Concentration:Good  Attention Span:Fair  Recall:Good  Fund of Knowledge:Good  Language:Good   Psychomotor Activity  Psychomotor Activity:Normal   Assets  Assets:Communication Skills; Desire for Improvement; Financial Resources/Insurance; Housing; Vocational/Educational;  Transportation   Sleep  Sleep:Fair  Number of hours: 0 (Patient endorses receiving 6 - 8 hours of sleep.)   No data recorded  Physical Exam: Physical Exam Psychiatric:        Attention and Perception: Attention and perception normal. She does not perceive auditory or visual hallucinations.        Mood and Affect: Affect normal. Mood is anxious and depressed.        Speech: Speech normal.        Behavior: Behavior normal. Behavior is cooperative.        Thought Content: Thought content normal. Thought content is not paranoid. Thought content does not include homicidal or suicidal ideation. Thought content does not include suicidal plan.        Cognition and Memory: Cognition is impaired. Memory is impaired.        Judgment: Judgment normal.   Review of Systems  Psychiatric/Behavioral:  Positive for depression. Negative for hallucinations, memory loss, substance abuse and suicidal ideas. The patient is nervous/anxious. The patient does not have insomnia.   Blood pressure 111/62, pulse 69, temperature 98.3 F (36.8 C), temperature source Oral, resp. rate 18, SpO2 99 %. There is no height or weight on file to calculate BMI.  Musculoskeletal: Strength & Muscle Tone: within normal limits Gait & Station: normal Patient leans: N/A   [facility] MSE Discharge Disposition for Follow up and Recommendations: Based on my evaluation the patient does not appear to have an emergency  medical condition and can be discharged with resources and follow up care in outpatient services for Medication Management.  Patient presents to [facility] due to worsening anxiety, depressive symptoms, sleep disturbances, intrusive thoughts.  Patient denies suicidal and homicidal ideations and further denies auditory or visual hallucinations.  Patient denies being a danger to herself and is able to contract for safety.  Patient is interested in being placed on an antidepressant for the management of her symptoms.  Provider to  place patient on Prozac 10 mg daily (30 day supply).  Provider advised patient to schedule an earlier follow-up appointment with her psychiatric provider.  Patient was agreeable to recommendation.  Meta Hatchet, PA 09/01/2021, 2:27 AM

## 2021-08-31 NOTE — ED Triage Notes (Signed)
Pt presents to [facility] with a complaint of anxiety symptoms, pt states that she has been having intrusive thoughts, pt states that she believes that this may be a trauma response to her losing her son 2 years ago. Pt states that she was diagnosed with anxiety and is currently compliant with medication, buspar, but she believes that the medication is not helping. Pt states that she may be having symptoms of depression such as; unmotivated and isolating. Pt states that she would benefit from medication management. Pt denies SI/HI and AVH at this time.

## 2021-08-31 NOTE — Discharge Instructions (Addendum)
Patient to keep all scheduled therapy appoints. Patient was informed to contact [facility] to schedule an earlier follow up appointment with her psychiatric provider (Dr. Jannifer Franklin).

## 2021-09-01 DIAGNOSIS — F411 Generalized anxiety disorder: Secondary | ICD-10-CM | POA: Diagnosis not present

## 2021-09-09 DIAGNOSIS — F411 Generalized anxiety disorder: Secondary | ICD-10-CM | POA: Diagnosis not present

## 2021-09-15 DIAGNOSIS — F411 Generalized anxiety disorder: Secondary | ICD-10-CM | POA: Diagnosis not present

## 2021-09-16 DIAGNOSIS — G4733 Obstructive sleep apnea (adult) (pediatric): Secondary | ICD-10-CM | POA: Diagnosis not present

## 2021-09-22 DIAGNOSIS — G4733 Obstructive sleep apnea (adult) (pediatric): Secondary | ICD-10-CM | POA: Diagnosis not present

## 2021-09-22 DIAGNOSIS — F411 Generalized anxiety disorder: Secondary | ICD-10-CM | POA: Diagnosis not present

## 2021-09-29 DIAGNOSIS — F331 Major depressive disorder, recurrent, moderate: Secondary | ICD-10-CM | POA: Diagnosis not present

## 2021-09-29 DIAGNOSIS — F1411 Cocaine abuse, in remission: Secondary | ICD-10-CM | POA: Diagnosis not present

## 2021-09-29 DIAGNOSIS — F411 Generalized anxiety disorder: Secondary | ICD-10-CM | POA: Diagnosis not present

## 2021-09-29 DIAGNOSIS — F1011 Alcohol abuse, in remission: Secondary | ICD-10-CM | POA: Diagnosis not present

## 2021-09-30 DIAGNOSIS — F411 Generalized anxiety disorder: Secondary | ICD-10-CM | POA: Diagnosis not present

## 2021-10-06 DIAGNOSIS — F411 Generalized anxiety disorder: Secondary | ICD-10-CM | POA: Diagnosis not present

## 2021-10-09 DIAGNOSIS — R41840 Attention and concentration deficit: Secondary | ICD-10-CM | POA: Diagnosis not present

## 2021-10-09 DIAGNOSIS — Z79899 Other long term (current) drug therapy: Secondary | ICD-10-CM | POA: Diagnosis not present

## 2021-10-09 DIAGNOSIS — F902 Attention-deficit hyperactivity disorder, combined type: Secondary | ICD-10-CM | POA: Diagnosis not present

## 2021-10-13 DIAGNOSIS — F411 Generalized anxiety disorder: Secondary | ICD-10-CM | POA: Diagnosis not present

## 2021-10-15 DIAGNOSIS — G4733 Obstructive sleep apnea (adult) (pediatric): Secondary | ICD-10-CM | POA: Diagnosis not present

## 2021-10-20 ENCOUNTER — Ambulatory Visit: Payer: BC Managed Care – PPO | Admitting: Internal Medicine

## 2021-10-20 ENCOUNTER — Encounter: Payer: Self-pay | Admitting: Internal Medicine

## 2021-10-20 VITALS — BP 104/68 | HR 59 | Ht 65.0 in | Wt 122.0 lb

## 2021-10-20 DIAGNOSIS — E0500 Thyrotoxicosis with diffuse goiter without thyrotoxic crisis or storm: Secondary | ICD-10-CM

## 2021-10-20 DIAGNOSIS — E0590 Thyrotoxicosis, unspecified without thyrotoxic crisis or storm: Secondary | ICD-10-CM | POA: Diagnosis not present

## 2021-10-20 DIAGNOSIS — F411 Generalized anxiety disorder: Secondary | ICD-10-CM | POA: Diagnosis not present

## 2021-10-20 NOTE — Progress Notes (Signed)
? ?Name: Stephanie Fisher  ?MRN/ DOB: [id number], 11/11/1965    ?Age/ Sex: 56 y.o., female   ? ? ?PCP: Dois Davenportichter, Karen L, MD   ?Reason for Endocrinology Evaluation: Hyperthyroidism   ?   ?Initial Endocrinology Clinic Visit: 05/10/2020  ? ? ?PATIENT IDENTIFIER: Stephanie Fisher is a 56 y.o., female with unremarkable  past medical history . She has followed with [facility] since 05/10/2020 for consultative assistance with management of her hyperthyroidism ? ? ? ? ? ?HISTORICAL SUMMARY: The patient was first diagnosed with Hyperthyroidism secondary to Graves' disease in 03/2020. A thyroid uptake and scan ( 03/2020 ) demonstrated a 24 hour I-123 uptake = 79.6%. Thyroid ultrasound did not reveal any nodules.  ? ?She had a loss summer 2020 and has cognitive issues as well as memory loss.  ? ?She has been on methimazole since 03/2020 ? ?She is adopted  ? ? ?Sees psychiatry , prozac exacerbated night symptoms.  ? ? ? ?SUBJECTIVE:  ? ? ?Today (10/20/2021):  Stephanie Fisher is here for a follow up on hyperthyroidism secondary to Graves' Disease.  ? ?Weight has been increasing  ?Has alternating  diarrhea and constipation, more on the constipations  ?Has been depressed  ?Denies local neck swelling  ?Has occasional palpitations , worsening  in the morning  ? ? ?Methimazole 5 mg, half tablet 3 days a week, 1 tablet the other 4 days of the week- She has been taking 1 tablet daily  ? ? ? ? ?HISTORY:  ?Past Medical History:  ?Past Medical History:  ?Diagnosis Date  ? ADHD (attention deficit hyperactivity disorder)   ? Anxiety   ? Depression   ? ?Past Surgical History: No past surgical history on file. ?Social History:  reports that she has never smoked. She has never used smokeless tobacco. She reports that she does not drink alcohol and does not use drugs. ?Family History:  ?Family History  ?Adopted: Yes  ? ? ? ?HOME MEDICATIONS: ?Allergies as of 10/20/2021   ?No Known Allergies ?  ? ?  ?Medication List  ?  ? ?  ? Accurate as  of October 20, 2021  3:31 PM. If you have any questions, ask your nurse or doctor.  ?  ?  ? ?  ? ?STOP taking these medications   ? ?divalproex 125 MG capsule ?Commonly known as: DEPAKOTE SPRINKLE ?Stopped by: Scarlette ShortsIbtehal J Ruslan Mccabe, MD ?  ?FLUoxetine 10 MG capsule ?Commonly known as: PROZAC ?Stopped by: Scarlette ShortsIbtehal J Shakora Nordquist, MD ?  ? ?  ? ?TAKE these medications   ? ?busPIRone 15 MG tablet ?Commonly known as: BUSPAR ?Take 7.5 mg by mouth once. ?  ?hydrOXYzine 10 MG tablet ?Commonly known as: ATARAX ?Take by mouth. ?  ?methimazole 5 MG tablet ?Commonly known as: TAPAZOLE ?Take 1 tablet (5 mg total) by mouth as directed. Half a tablet 3 days a week, 1 tablet the rest of the week ?What changed: additional instructions ?  ?sertraline 50 MG tablet ?Commonly known as: ZOLOFT ?Take 50 mg by mouth daily. ?  ? ?  ? ? ? ? ?OBJECTIVE:  ? ?PHYSICAL EXAM: ?VS: BP 104/68 (BP Location: Left Arm, Patient Position: Sitting, Cuff Size: Small)   Pulse (!) 59   Ht 5\' 5"  (1.651 m)   Wt 122 lb (55.3 kg)   LMP  (LMP Unknown) Comment: over 1 year  SpO2 99%   BMI 20.30 kg/m?  ? ? ?EXAM: ?General: Pt appears well and is in NAD  ?  Neck: General: Supple without adenopathy. ?Thyroid: Thyroid size normal.  No goiter or nodules appreciated.   ?Lungs: Clear with good BS bilat with no rales, rhonchi, or wheezes  ?Heart: Auscultation: RRR.  ?Abdomen: Normoactive bowel sounds, soft, nontender, without masses or organomegaly palpable  ?Extremities:  ?BL LE: No pretibial edema normal ROM and strength.  ?Mental Status: Judgment, insight: Intact ?Orientation: Oriented to time, place, and person ?Mood and affect: Patient became tearful in the office  ? ? ? ?DATA REVIEWED: ? ? Latest Reference Range & Units 10/20/21 15:29  ?Sodium 135 - 145 mEq/L 139  ?Potassium 3.5 - 5.1 mEq/L 4.1  ?Chloride 96 - 112 mEq/L 103  ?CO2 19 - 32 mEq/L 28  ?Glucose 70 - 99 mg/dL 81  ?BUN 6 - 23 mg/dL 13  ?Creatinine 0.40 - 1.20 mg/dL 9.45  ?Calcium 8.4 - 10.5 mg/dL 9.3   ?Alkaline Phosphatase 39 - 117 U/L 46  ?Albumin 3.5 - 5.2 g/dL 4.5  ?AST 0 - 37 U/L 21  ?ALT 0 - 35 U/L 20  ?Total Protein 6.0 - 8.3 g/dL 6.5  ?Total Bilirubin 0.2 - 1.2 mg/dL 0.7  ?GFR >60.00 mL/min 74.08  ?WBC 4.0 - 10.5 K/uL 4.3  ?RBC 3.87 - 5.11 Mil/uL 4.48  ?Hemoglobin 12.0 - 15.0 g/dL 85.9  ?HCT 36.0 - 46.0 % 39.2  ?MCV 78.0 - 100.0 fl 87.6  ?MCHC 30.0 - 36.0 g/dL 29.2  ?RDW 11.5 - 15.5 % 12.7  ?Platelets 150.0 - 400.0 K/uL 223.0  ?Neutrophils 43.0 - 77.0 % 48.1  ?Lymphocytes 12.0 - 46.0 % 42.1  ?Monocytes Relative 3.0 - 12.0 % 7.2  ?Eosinophil 0.0 - 5.0 % 1.3  ?Basophil 0.0 - 3.0 % 1.3  ?NEUT# 1.4 - 7.7 K/uL 2.1  ?Lymphocyte # 0.7 - 4.0 K/uL 1.8  ?Monocyte # 0.1 - 1.0 K/uL 0.3  ?Eosinophils Absolute 0.0 - 0.7 K/uL 0.1  ?Basophils Absolute 0.0 - 0.1 K/uL 0.1  ? ? Latest Reference Range & Units 10/20/21 15:29  ?TSH 0.35 - 5.50 uIU/mL 5.97 (H)  ?T4,Free(Direct) 0.60 - 1.60 ng/dL 4.46  ? ? ? ? ? ?Thyroid Uptake and Scan 04/18/2020 ? ?Homogeneous tracer distribution in both thyroid lobes. ?  ?No focal areas of increased or decreased tracer localization. ?  ?4 hour I-123 uptake = 45.2% (normal 5-20%) ?  ?24 hour I-123 uptake = 79.6% (normal 10-30%) ?  ?IMPRESSION: ?Normal thyroid scan. ?  ?Markedly elevated 4 hour and 24 hour radio iodine uptakes. ?  ?Findings consistent with Graves disease. ? ?Thyroid ultrasound 03/27/2020 ? ?No discrete nodules are seen within the thyroid gland. The thyroid ?parenchyma is very hypervascular bilaterally. No abnormal lymph ?nodes identified. ?  ?IMPRESSION: ?Thyroid parenchyma is heterogeneous and very hypervascular without ?focal nodules. Overall thyroid gland volume is within normal limits. ?Increased vascularity can be suggestive of thyroiditis. ? ?ASSESSMENT / PLAN / RECOMMENDATIONS:  ? ?Hyperthyroidism secondary to Graves' Disease: ? ? ?- Pt has been noted with weight gain and depression ?- Unfortunately, she did not reduce methimazole dose as previously advised ?-TFTs today  continue to show elevated TSH , will reduce as below  ?-Today we discussed that she has been on methimazole therapy for approximately 1.5 years, we discussed other options of treatment  including ablation therapy with radioactive iodine versus antithyroid drug treatment versus surgical therapy.   ?-I carefully explained to the patient that one of the consequences of I-131 ablation treatment would likely be permanent hypothyroidism which would require long-term replacement therapy with LT4. ? ?  Medications  ? Decrease Methimazole 5 mg , half a tablet daily  ? ? ? ? ?2. Graves' Disease: ? ?- No extra thyroidal manifestations of Graves' disease ? ? ? ? ? ?F/U in 4 months  ? ? ? ?Signed electronically by: ?Abby Raelyn Mora, MD ? ?[facility]  ?[facility] ?[address] ?[place], [place] [address] ?Phone: [phone] ?FAX: [phone]  ? ? ? ? ?CC: ?Dois Davenport, MD ?[address] [place] [address] [place] ?[place] [address] ?Phone: [phone]  ?Fax: [phone] ? ? ?Return to Endocrinology clinic as below: ?Future Appointments  ?Date Time Provider Department Center  ?03/13/2022  2:00 PM Jacorie Ernsberger, Konrad Dolores, MD [id number] None  ?  ? ?

## 2021-10-21 LAB — COMPREHENSIVE METABOLIC PANEL
ALT: 20 U/L (ref 0–35)
AST: 21 U/L (ref 0–37)
Albumin: 4.5 g/dL (ref 3.5–5.2)
Alkaline Phosphatase: 46 U/L (ref 39–117)
BUN: 13 mg/dL (ref 6–23)
CO2: 28 mEq/L (ref 19–32)
Calcium: 9.3 mg/dL (ref 8.4–10.5)
Chloride: 103 mEq/L (ref 96–112)
Creatinine, Ser: 0.88 mg/dL (ref 0.40–1.20)
GFR: 74.08 mL/min (ref >60.00)
Glucose, Bld: 81 mg/dL (ref 70–99)
Potassium: 4.1 mEq/L (ref 3.5–5.1)
Sodium: 139 mEq/L (ref 135–145)
Total Bilirubin: 0.7 mg/dL (ref 0.2–1.2)
Total Protein: 6.5 g/dL (ref 6.0–8.3)

## 2021-10-21 LAB — CBC WITH DIFFERENTIAL/PLATELET
Basophils Absolute: 0.1 10*3/uL (ref 0.0–0.1)
Basophils Relative: 1.3 % (ref 0.0–3.0)
Eosinophils Absolute: 0.1 10*3/uL (ref 0.0–0.7)
Eosinophils Relative: 1.3 % (ref 0.0–5.0)
HCT: 39.2 % (ref 36.0–46.0)
Hemoglobin: 13.2 g/dL (ref 12.0–15.0)
Lymphocytes Relative: 42.1 % (ref 12.0–46.0)
Lymphs Abs: 1.8 10*3/uL (ref 0.7–4.0)
MCHC: 33.7 g/dL (ref 30.0–36.0)
MCV: 87.6 fl (ref 78.0–100.0)
Monocytes Absolute: 0.3 10*3/uL (ref 0.1–1.0)
Monocytes Relative: 7.2 % (ref 3.0–12.0)
Neutro Abs: 2.1 10*3/uL (ref 1.4–7.7)
Neutrophils Relative %: 48.1 % (ref 43.0–77.0)
Platelets: 223 10*3/uL (ref 150.0–400.0)
RBC: 4.48 Mil/uL (ref 3.87–5.11)
RDW: 12.7 % (ref 11.5–15.5)
WBC: 4.3 10*3/uL (ref 4.0–10.5)

## 2021-10-21 LAB — TSH: TSH: 5.97 u[IU]/mL — ABNORMAL HIGH (ref 0.35–5.50)

## 2021-10-21 LAB — T4, FREE: Free T4: 0.7 ng/dL (ref 0.60–1.60)

## 2021-10-22 MED ORDER — METHIMAZOLE 5 MG PO TABS: 2.5000 mg | ORAL_TABLET | Freq: Every day | ORAL | 2 refills | Status: DC

## 2021-10-23 DIAGNOSIS — F411 Generalized anxiety disorder: Secondary | ICD-10-CM | POA: Diagnosis not present

## 2021-10-23 DIAGNOSIS — F1011 Alcohol abuse, in remission: Secondary | ICD-10-CM | POA: Diagnosis not present

## 2021-10-23 DIAGNOSIS — F331 Major depressive disorder, recurrent, moderate: Secondary | ICD-10-CM | POA: Diagnosis not present

## 2021-10-23 DIAGNOSIS — F1411 Cocaine abuse, in remission: Secondary | ICD-10-CM | POA: Diagnosis not present

## 2021-10-27 DIAGNOSIS — F411 Generalized anxiety disorder: Secondary | ICD-10-CM | POA: Diagnosis not present

## 2021-11-03 DIAGNOSIS — F411 Generalized anxiety disorder: Secondary | ICD-10-CM | POA: Diagnosis not present

## 2021-11-10 DIAGNOSIS — F411 Generalized anxiety disorder: Secondary | ICD-10-CM | POA: Diagnosis not present

## 2021-11-15 DIAGNOSIS — G4733 Obstructive sleep apnea (adult) (pediatric): Secondary | ICD-10-CM | POA: Diagnosis not present

## 2021-11-17 DIAGNOSIS — F411 Generalized anxiety disorder: Secondary | ICD-10-CM | POA: Diagnosis not present

## 2021-11-19 ENCOUNTER — Ambulatory Visit: Payer: BC Managed Care – PPO | Admitting: Internal Medicine

## 2021-11-21 DIAGNOSIS — F902 Attention-deficit hyperactivity disorder, combined type: Secondary | ICD-10-CM | POA: Diagnosis not present

## 2021-11-21 DIAGNOSIS — Z79899 Other long term (current) drug therapy: Secondary | ICD-10-CM | POA: Diagnosis not present

## 2021-11-24 DIAGNOSIS — F411 Generalized anxiety disorder: Secondary | ICD-10-CM | POA: Diagnosis not present

## 2021-11-26 DIAGNOSIS — F1011 Alcohol abuse, in remission: Secondary | ICD-10-CM | POA: Diagnosis not present

## 2021-11-26 DIAGNOSIS — F331 Major depressive disorder, recurrent, moderate: Secondary | ICD-10-CM | POA: Diagnosis not present

## 2021-11-26 DIAGNOSIS — F411 Generalized anxiety disorder: Secondary | ICD-10-CM | POA: Diagnosis not present

## 2021-11-26 DIAGNOSIS — F1411 Cocaine abuse, in remission: Secondary | ICD-10-CM | POA: Diagnosis not present

## 2021-12-01 DIAGNOSIS — F411 Generalized anxiety disorder: Secondary | ICD-10-CM | POA: Diagnosis not present

## 2021-12-08 DIAGNOSIS — F411 Generalized anxiety disorder: Secondary | ICD-10-CM | POA: Diagnosis not present

## 2021-12-15 DIAGNOSIS — G4733 Obstructive sleep apnea (adult) (pediatric): Secondary | ICD-10-CM | POA: Diagnosis not present

## 2021-12-22 DIAGNOSIS — F331 Major depressive disorder, recurrent, moderate: Secondary | ICD-10-CM | POA: Diagnosis not present

## 2021-12-22 DIAGNOSIS — F411 Generalized anxiety disorder: Secondary | ICD-10-CM | POA: Diagnosis not present

## 2021-12-22 DIAGNOSIS — F1411 Cocaine abuse, in remission: Secondary | ICD-10-CM | POA: Diagnosis not present

## 2021-12-22 DIAGNOSIS — F1011 Alcohol abuse, in remission: Secondary | ICD-10-CM | POA: Diagnosis not present

## 2021-12-24 DIAGNOSIS — F4312 Post-traumatic stress disorder, chronic: Secondary | ICD-10-CM | POA: Diagnosis not present

## 2021-12-24 DIAGNOSIS — G4733 Obstructive sleep apnea (adult) (pediatric): Secondary | ICD-10-CM | POA: Diagnosis not present

## 2021-12-24 DIAGNOSIS — E0500 Thyrotoxicosis with diffuse goiter without thyrotoxic crisis or storm: Secondary | ICD-10-CM | POA: Diagnosis not present

## 2021-12-24 DIAGNOSIS — R4189 Other symptoms and signs involving cognitive functions and awareness: Secondary | ICD-10-CM | POA: Diagnosis not present

## 2021-12-26 DIAGNOSIS — R4189 Other symptoms and signs involving cognitive functions and awareness: Secondary | ICD-10-CM | POA: Diagnosis not present

## 2021-12-26 DIAGNOSIS — G4733 Obstructive sleep apnea (adult) (pediatric): Secondary | ICD-10-CM | POA: Diagnosis not present

## 2021-12-26 DIAGNOSIS — E0590 Thyrotoxicosis, unspecified without thyrotoxic crisis or storm: Secondary | ICD-10-CM | POA: Diagnosis not present

## 2021-12-26 DIAGNOSIS — F411 Generalized anxiety disorder: Secondary | ICD-10-CM | POA: Diagnosis not present

## 2022-01-05 DIAGNOSIS — F411 Generalized anxiety disorder: Secondary | ICD-10-CM | POA: Diagnosis not present

## 2022-01-14 DIAGNOSIS — G479 Sleep disorder, unspecified: Secondary | ICD-10-CM | POA: Diagnosis not present

## 2022-01-14 DIAGNOSIS — F411 Generalized anxiety disorder: Secondary | ICD-10-CM | POA: Diagnosis not present

## 2022-01-14 DIAGNOSIS — G4733 Obstructive sleep apnea (adult) (pediatric): Secondary | ICD-10-CM | POA: Diagnosis not present

## 2022-01-14 DIAGNOSIS — F988 Other specified behavioral and emotional disorders with onset usually occurring in childhood and adolescence: Secondary | ICD-10-CM | POA: Diagnosis not present

## 2022-01-15 ENCOUNTER — Other Ambulatory Visit: Payer: Self-pay | Admitting: Internal Medicine

## 2022-01-15 DIAGNOSIS — G4733 Obstructive sleep apnea (adult) (pediatric): Secondary | ICD-10-CM | POA: Diagnosis not present

## 2022-01-26 DIAGNOSIS — F411 Generalized anxiety disorder: Secondary | ICD-10-CM | POA: Diagnosis not present

## 2022-02-09 DIAGNOSIS — F411 Generalized anxiety disorder: Secondary | ICD-10-CM | POA: Diagnosis not present

## 2022-02-14 DIAGNOSIS — G4733 Obstructive sleep apnea (adult) (pediatric): Secondary | ICD-10-CM | POA: Diagnosis not present

## 2022-02-17 DIAGNOSIS — F411 Generalized anxiety disorder: Secondary | ICD-10-CM | POA: Diagnosis not present

## 2022-03-02 DIAGNOSIS — F411 Generalized anxiety disorder: Secondary | ICD-10-CM | POA: Diagnosis not present

## 2022-03-10 DIAGNOSIS — F411 Generalized anxiety disorder: Secondary | ICD-10-CM | POA: Diagnosis not present

## 2022-03-13 ENCOUNTER — Ambulatory Visit: Payer: BC Managed Care – PPO | Admitting: Internal Medicine

## 2022-03-13 ENCOUNTER — Encounter: Payer: Self-pay | Admitting: Internal Medicine

## 2022-03-13 VITALS — BP 104/72 | HR 63 | Ht 65.0 in | Wt 120.2 lb

## 2022-03-13 DIAGNOSIS — E0500 Thyrotoxicosis with diffuse goiter without thyrotoxic crisis or storm: Secondary | ICD-10-CM

## 2022-03-13 DIAGNOSIS — E0590 Thyrotoxicosis, unspecified without thyrotoxic crisis or storm: Secondary | ICD-10-CM

## 2022-03-13 LAB — TSH: TSH: 3.78 u[IU]/mL (ref 0.35–5.50)

## 2022-03-13 LAB — T4, FREE: Free T4: 0.65 ng/dL (ref 0.60–1.60)

## 2022-03-13 NOTE — Progress Notes (Unsigned)
Name: Stephanie Fisher  MRN/ DOB: [id number], [birth year]    Age/ Sex: 56 y.o., female     PCP: Dois Davenport, MD   Reason for Endocrinology Evaluation: Hyperthyroidism      Initial Endocrinology Clinic Visit: 05/10/2020    PATIENT IDENTIFIER: Stephanie Fisher is a 56 y.o., female with unremarkable  past medical history . She has followed with [facility] since 05/10/2020 for consultative assistance with management of her hyperthyroidism      HISTORICAL SUMMARY: The patient was first diagnosed with Hyperthyroidism secondary to Graves' disease in 03/2020. A thyroid uptake and scan ( 03/2020 ) demonstrated a 24 hour I-123 uptake = 79.6%. Thyroid ultrasound did not reveal any nodules.   She had a loss summer 2020 and has cognitive issues as well as memory loss.   She has been on methimazole since 03/2020  She is adopted    Sees psychiatry , prozac exacerbated night symptoms.     SUBJECTIVE:    Today (03/13/2022):  Stephanie Fisher is here for a follow up on hyperthyroidism secondary to Graves' Disease.   Weight has been stable  Palpitations noted in the morning when she wakes up  Has noted constipation  Has been more  anxious lately  Denies local neck swelling  Denies palpitations , but has noted muscle twitching   Methimazole 5 mg, half a tablet daily- has been taking half 4 days , 3 days 1 tab    HISTORY:  Past Medical History:  Past Medical History:  Diagnosis Date   ADHD (attention deficit hyperactivity disorder)    Anxiety    Depression    Past Surgical History: No past surgical history on file. Social History:  reports that she has never smoked. She has never used smokeless tobacco. She reports that she does not drink alcohol and does not use drugs. Family History:  Family History  Adopted: Yes     HOME MEDICATIONS: Allergies as of 03/13/2022   No Known Allergies      Medication List        Accurate as of March 13, 2022  7:32 AM.  If you have any questions, ask your nurse or doctor.          busPIRone 15 MG tablet Commonly known as: BUSPAR Take 7.5 mg by mouth once.   hydrOXYzine 10 MG tablet Commonly known as: ATARAX Take by mouth.   methimazole 5 MG tablet Commonly known as: TAPAZOLE Take 0.5 tablets (2.5 mg total) by mouth daily.   sertraline 50 MG tablet Commonly known as: ZOLOFT Take 50 mg by mouth daily.          OBJECTIVE:   PHYSICAL EXAM: VS: LMP  (LMP Unknown) Comment: over 1 year   EXAM: General: Pt appears well and is in NAD  Neck: General: Supple without adenopathy. Thyroid: Thyroid size normal.  No goiter or nodules appreciated.   Lungs: Clear with good BS bilat with no rales, rhonchi, or wheezes  Heart: Auscultation: RRR.  Abdomen: Normoactive bowel sounds, soft, nontender, without masses or organomegaly palpable  Extremities:  BL LE: No pretibial edema normal ROM and strength.  Mental Status: Judgment, insight: Intact Orientation: Oriented to time, place, and person Mood and affect: Patient became tearful in the office     DATA REVIEWED:   Latest Reference Range & Units 10/20/21 15:29  Sodium 135 - 145 mEq/L 139  Potassium 3.5 - 5.1 mEq/L 4.1  Chloride 96 - 112 mEq/L 103  CO2 19 - 32 mEq/L 28  Glucose 70 - 99 mg/dL 81  BUN 6 - 23 mg/dL 13  Creatinine 8.52 - 7.78 mg/dL 2.42  Calcium 8.4 - 35.3 mg/dL 9.3  Alkaline Phosphatase 39 - 117 U/L 46  Albumin 3.5 - 5.2 g/dL 4.5  AST 0 - 37 U/L 21  ALT 0 - 35 U/L 20  Total Protein 6.0 - 8.3 g/dL 6.5  Total Bilirubin 0.2 - 1.2 mg/dL 0.7  GFR >61.44 mL/min 74.08  WBC 4.0 - 10.5 K/uL 4.3  RBC 3.87 - 5.11 Mil/uL 4.48  Hemoglobin 12.0 - 15.0 g/dL 31.5  HCT 40.0 - 86.7 % 39.2  MCV 78.0 - 100.0 fl 87.6  MCHC 30.0 - 36.0 g/dL 61.9  RDW 50.9 - 32.6 % 12.7  Platelets 150.0 - 400.0 K/uL 223.0  Neutrophils 43.0 - 77.0 % 48.1  Lymphocytes 12.0 - 46.0 % 42.1  Monocytes Relative 3.0 - 12.0 % 7.2  Eosinophil 0.0 - 5.0 % 1.3   Basophil 0.0 - 3.0 % 1.3  NEUT# 1.4 - 7.7 K/uL 2.1  Lymphocyte # 0.7 - 4.0 K/uL 1.8  Monocyte # 0.1 - 1.0 K/uL 0.3  Eosinophils Absolute 0.0 - 0.7 K/uL 0.1  Basophils Absolute 0.0 - 0.1 K/uL 0.1    Latest Reference Range & Units 10/20/21 15:29  TSH 0.35 - 5.50 uIU/mL 5.97 (H)  T4,Free(Direct) 0.60 - 1.60 ng/dL 7.12       Thyroid Uptake and Scan 04/18/2020  Homogeneous tracer distribution in both thyroid lobes.   No focal areas of increased or decreased tracer localization.   4 hour I-123 uptake = 45.2% (normal 5-20%)   24 hour I-123 uptake = 79.6% (normal 10-30%)   IMPRESSION: Normal thyroid scan.   Markedly elevated 4 hour and 24 hour radio iodine uptakes.   Findings consistent with Graves disease.  Thyroid ultrasound 03/27/2020  No discrete nodules are seen within the thyroid gland. The thyroid parenchyma is very hypervascular bilaterally. No abnormal lymph nodes identified.   IMPRESSION: Thyroid parenchyma is heterogeneous and very hypervascular without focal nodules. Overall thyroid gland volume is within normal limits. Increased vascularity can be suggestive of thyroiditis.  ASSESSMENT / PLAN / RECOMMENDATIONS:   Hyperthyroidism secondary to Graves' Disease:   - Pt has been noted with weight gain and depression - Unfortunately, she did not reduce methimazole dose as previously advised -TFTs today continue to show elevated TSH , will reduce as below  -Today we discussed that she has been on methimazole therapy for approximately 1.5 years, we discussed other options of treatment  including ablation therapy with radioactive iodine versus antithyroid drug treatment versus surgical therapy.   -I carefully explained to the patient that one of the consequences of I-131 ablation treatment would likely be permanent hypothyroidism which would require long-term replacement therapy with LT4.  Medications   Decrease Methimazole 5 mg , half a tablet daily      2.  Graves' Disease:  - No extra thyroidal manifestations of Graves' disease      F/U in 4 months     Signed electronically by: Lyndle Herrlich, MD  [facility]  [facility] [address] [place], [place] [address] Phone: [phone] FAX: [phone]      CC: Dois Davenport, MD [address] [place] [address] Phone: [phone]  Fax: [phone]   Return to Endocrinology clinic as below: Future Appointments  Date Time Provider Department Center  03/13/2022  2:00 PM  Yahye Siebert, Konrad Dolores, MD [id number] None

## 2022-03-14 LAB — T3: T3, Total: 68 ng/dL — ABNORMAL LOW (ref 76–181)

## 2022-03-16 MED ORDER — METHIMAZOLE 5 MG PO TABS
2.5000 mg | ORAL_TABLET | Freq: Every day | ORAL | 2 refills | Status: AC
Start: 2022-03-16 — End: ?

## 2022-03-17 DIAGNOSIS — G4733 Obstructive sleep apnea (adult) (pediatric): Secondary | ICD-10-CM | POA: Diagnosis not present

## 2022-03-17 DIAGNOSIS — F411 Generalized anxiety disorder: Secondary | ICD-10-CM | POA: Diagnosis not present

## 2022-03-27 DIAGNOSIS — F411 Generalized anxiety disorder: Secondary | ICD-10-CM | POA: Diagnosis not present

## 2022-04-02 DIAGNOSIS — F411 Generalized anxiety disorder: Secondary | ICD-10-CM | POA: Diagnosis not present

## 2022-04-07 DIAGNOSIS — F411 Generalized anxiety disorder: Secondary | ICD-10-CM | POA: Diagnosis not present

## 2022-04-14 DIAGNOSIS — F411 Generalized anxiety disorder: Secondary | ICD-10-CM | POA: Diagnosis not present

## 2022-04-20 DIAGNOSIS — F411 Generalized anxiety disorder: Secondary | ICD-10-CM | POA: Diagnosis not present

## 2022-04-22 IMAGING — DX DG LUMBAR SPINE 2-3V
3 series · 3 of 3 positions shown · non-contrast
Comparison: No prior.

CLINICAL DATA: Low back pain.  Prior history of trauma.

EXAM:
LUMBAR SPINE - 2-3 VIEW

[l-spine ap]
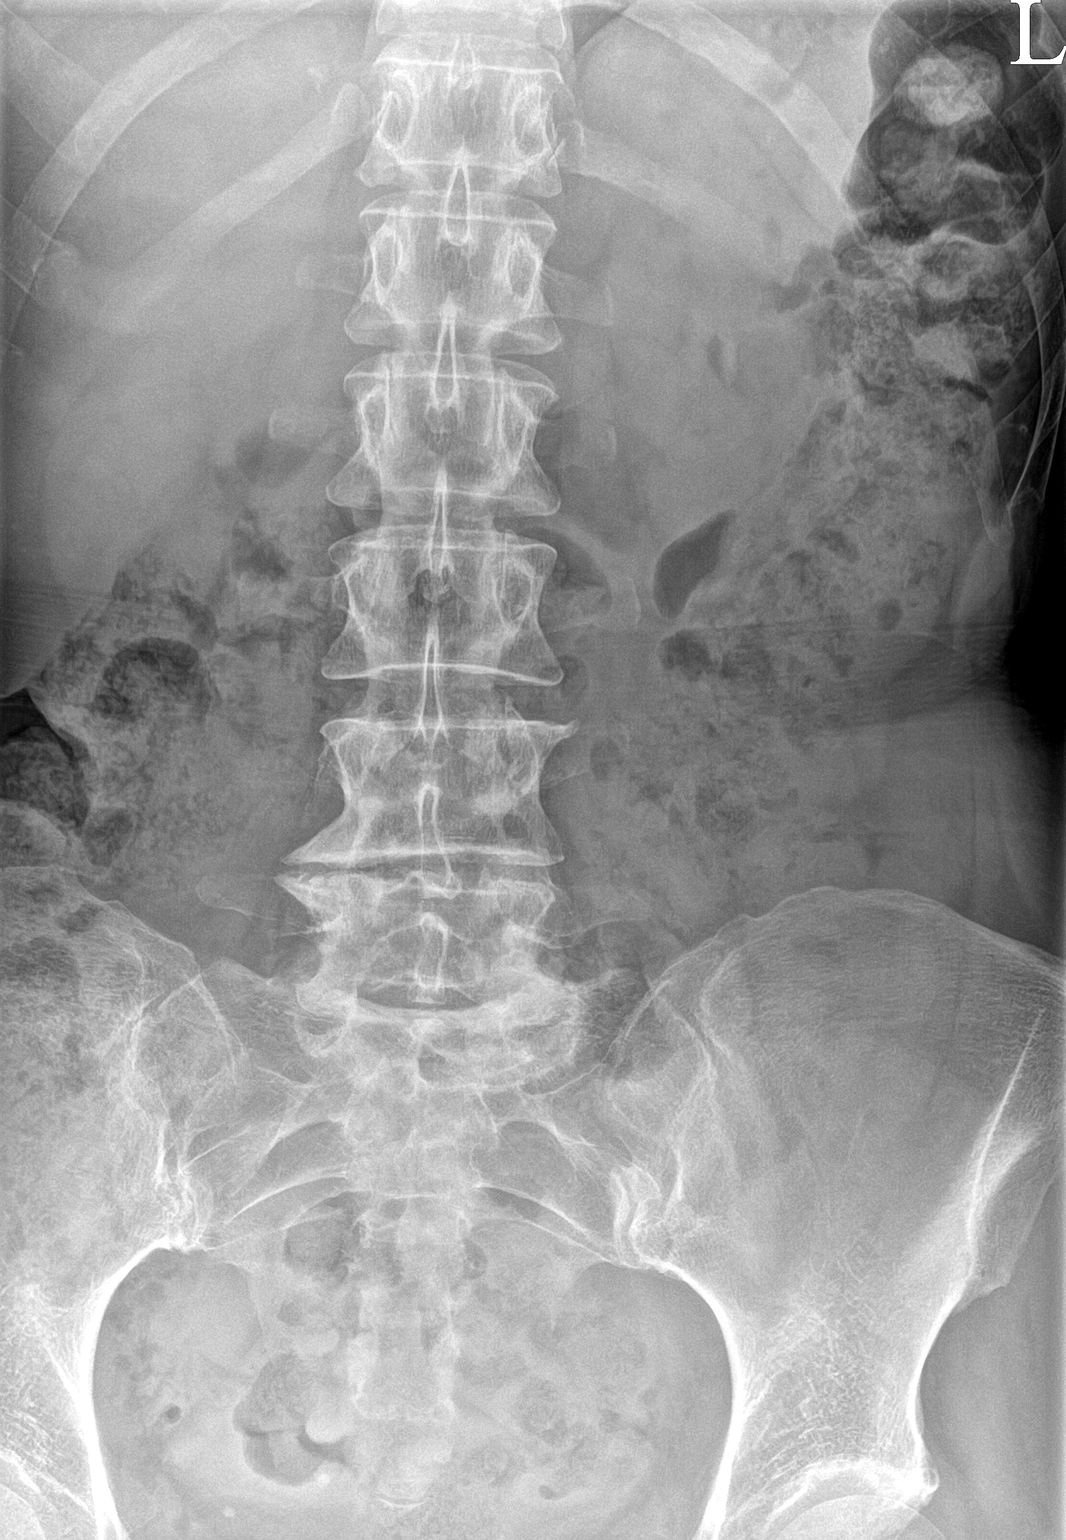

[l-spine lateral]
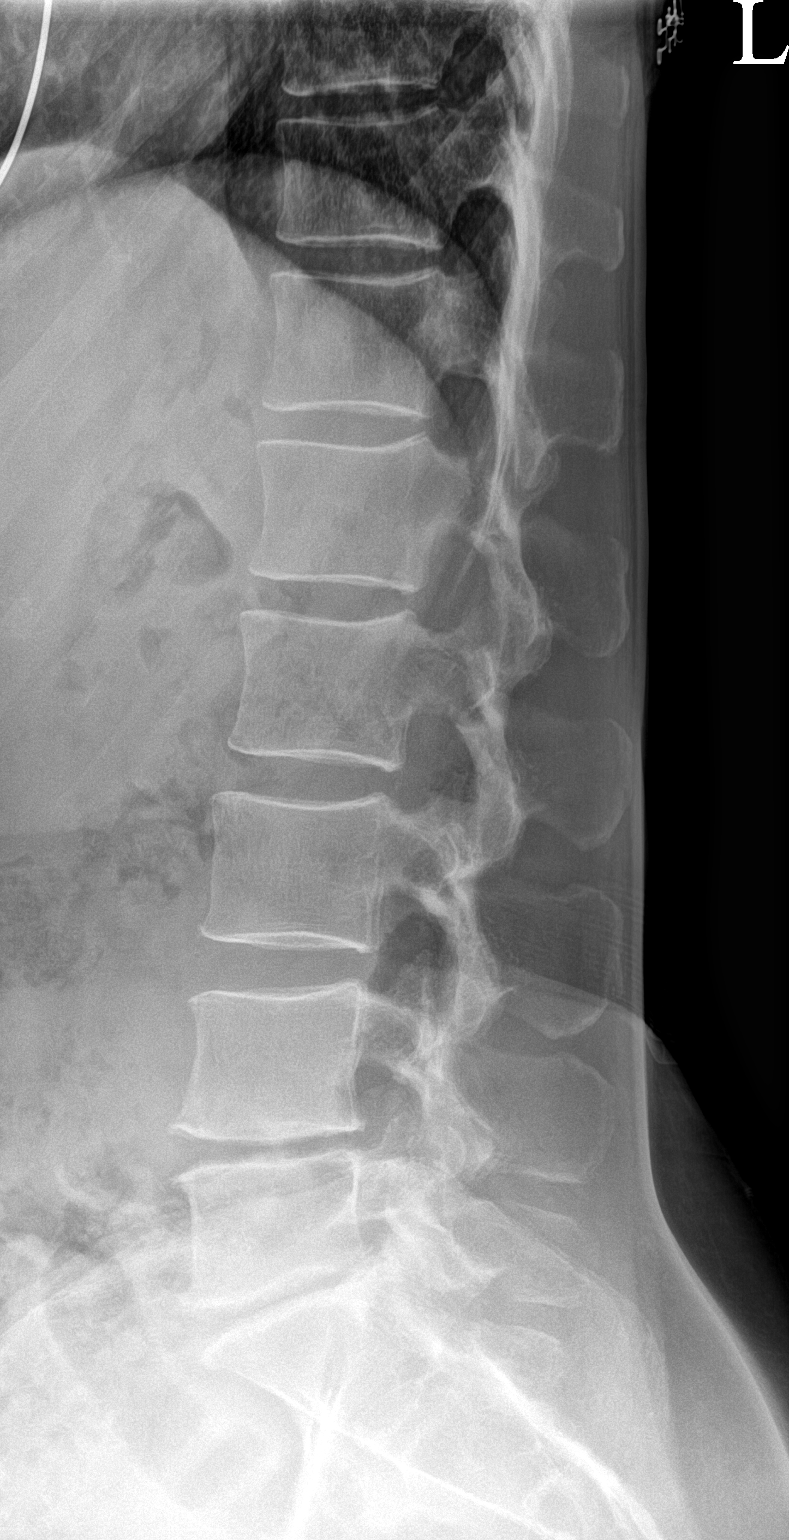

[l-spine spot]
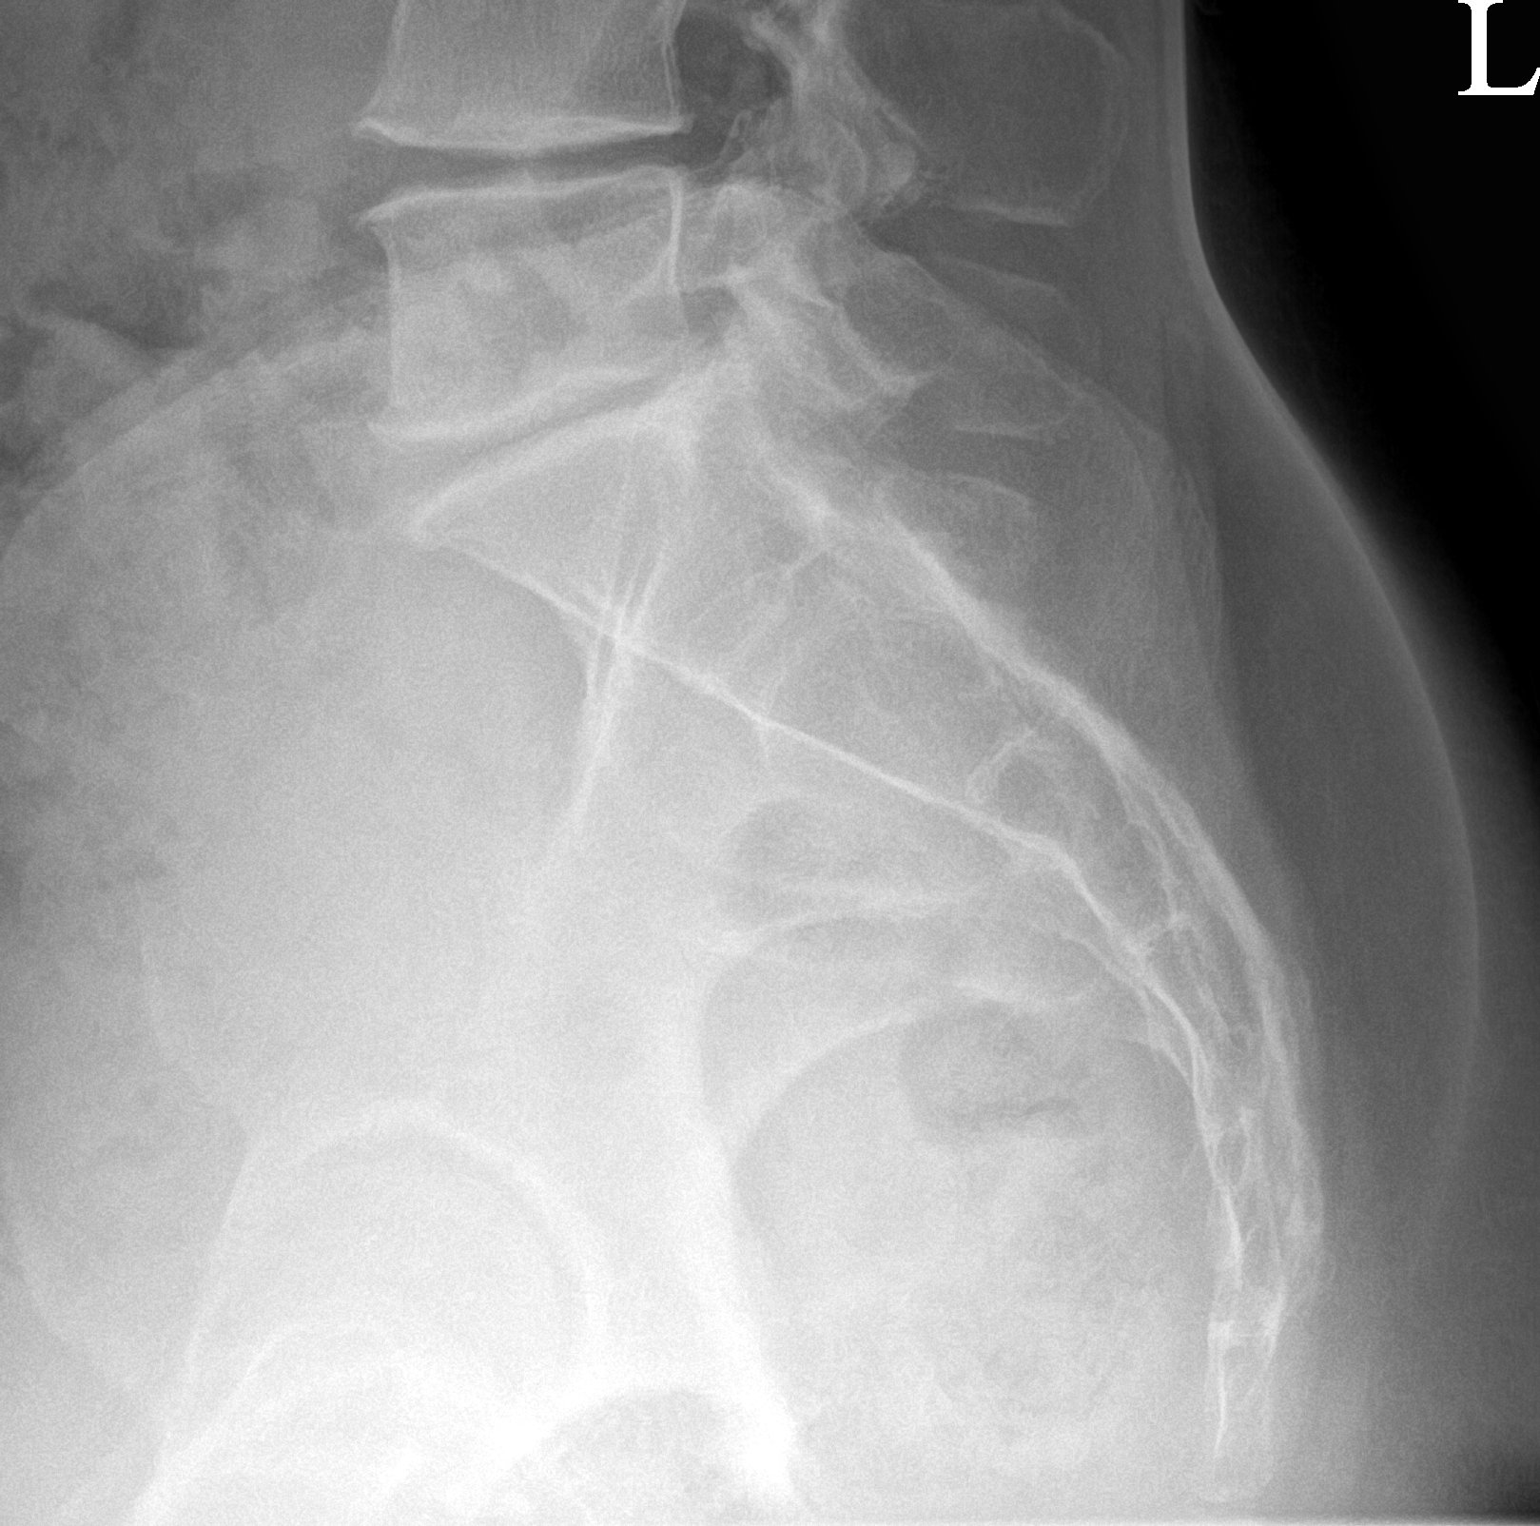

[3 of 3 positions shown; findings below may reference images not displayed]

FINDINGS: Lumbar spine numbered the lowest segmented appearing lumbar shaped
vertebrae on lateral view as L5. Multilevel degenerative change with
prominent L4-L5 and L5-S1 disc degeneration. Lower lumbar facet
hypertrophy. No acute bony abnormality. No evidence of fracture.
Pelvic calcifications consistent phleboliths.
IMPRESSION: Multilevel degenerative change with prominent L4-L5 and L5-S1 disc
degeneration. Lower lumbar facet hypertrophy. No acute bony
abnormality.

## 2022-04-28 DIAGNOSIS — F411 Generalized anxiety disorder: Secondary | ICD-10-CM | POA: Diagnosis not present

## 2022-04-30 DIAGNOSIS — F1411 Cocaine abuse, in remission: Secondary | ICD-10-CM | POA: Diagnosis not present

## 2022-04-30 DIAGNOSIS — F331 Major depressive disorder, recurrent, moderate: Secondary | ICD-10-CM | POA: Diagnosis not present

## 2022-04-30 DIAGNOSIS — F1011 Alcohol abuse, in remission: Secondary | ICD-10-CM | POA: Diagnosis not present

## 2022-04-30 DIAGNOSIS — F411 Generalized anxiety disorder: Secondary | ICD-10-CM | POA: Diagnosis not present

## 2022-05-01 DIAGNOSIS — F411 Generalized anxiety disorder: Secondary | ICD-10-CM | POA: Diagnosis not present

## 2022-05-05 DIAGNOSIS — F411 Generalized anxiety disorder: Secondary | ICD-10-CM | POA: Diagnosis not present

## 2022-05-12 DIAGNOSIS — F411 Generalized anxiety disorder: Secondary | ICD-10-CM | POA: Diagnosis not present

## 2022-05-14 DIAGNOSIS — F411 Generalized anxiety disorder: Secondary | ICD-10-CM | POA: Diagnosis not present

## 2022-05-18 DIAGNOSIS — G4733 Obstructive sleep apnea (adult) (pediatric): Secondary | ICD-10-CM | POA: Diagnosis not present

## 2022-05-18 DIAGNOSIS — G4719 Other hypersomnia: Secondary | ICD-10-CM | POA: Diagnosis not present

## 2022-05-19 DIAGNOSIS — F411 Generalized anxiety disorder: Secondary | ICD-10-CM | POA: Diagnosis not present

## 2022-05-26 DIAGNOSIS — F411 Generalized anxiety disorder: Secondary | ICD-10-CM | POA: Diagnosis not present

## 2022-06-02 DIAGNOSIS — F411 Generalized anxiety disorder: Secondary | ICD-10-CM | POA: Diagnosis not present

## 2022-06-16 DIAGNOSIS — F411 Generalized anxiety disorder: Secondary | ICD-10-CM | POA: Diagnosis not present

## 2022-06-23 DIAGNOSIS — F411 Generalized anxiety disorder: Secondary | ICD-10-CM | POA: Diagnosis not present

## 2022-06-24 DIAGNOSIS — G4733 Obstructive sleep apnea (adult) (pediatric): Secondary | ICD-10-CM | POA: Diagnosis not present

## 2022-07-02 DIAGNOSIS — F411 Generalized anxiety disorder: Secondary | ICD-10-CM | POA: Diagnosis not present

## 2022-07-06 ENCOUNTER — Ambulatory Visit: Payer: BC Managed Care – PPO | Admitting: Internal Medicine

## 2022-07-06 DIAGNOSIS — F411 Generalized anxiety disorder: Secondary | ICD-10-CM | POA: Diagnosis not present

## 2022-09-18 ENCOUNTER — Ambulatory Visit: Payer: BC Managed Care – PPO | Admitting: Internal Medicine
